# Patient Record
Sex: Female | Born: 1990 | Race: Black or African American | Hispanic: No | Marital: Married | Smoking: Former smoker
Health system: Southern US, Community
[De-identification: ages and names within clinical notes are randomized; demographics above are authoritative.]

## PROBLEM LIST (undated history)

## (undated) DIAGNOSIS — N390 Urinary tract infection, site not specified: Secondary | ICD-10-CM

## (undated) DIAGNOSIS — A599 Trichomoniasis, unspecified: Secondary | ICD-10-CM

## (undated) DIAGNOSIS — R011 Cardiac murmur, unspecified: Secondary | ICD-10-CM

## (undated) DIAGNOSIS — I10 Essential (primary) hypertension: Secondary | ICD-10-CM

## (undated) DIAGNOSIS — D649 Anemia, unspecified: Secondary | ICD-10-CM

## (undated) DIAGNOSIS — F1910 Other psychoactive substance abuse, uncomplicated: Secondary | ICD-10-CM

## (undated) HISTORY — DX: Cardiac murmur, unspecified: R01.1

## (undated) HISTORY — DX: Trichomoniasis, unspecified: A59.9

## (undated) HISTORY — DX: Other psychoactive substance abuse, uncomplicated: F19.10

## (undated) HISTORY — DX: Essential (primary) hypertension: I10

## (undated) HISTORY — PX: CYST EXCISION: SHX5701

## (undated) HISTORY — DX: Anemia, unspecified: D64.9

## (undated) HISTORY — DX: Urinary tract infection, site not specified: N39.0

---
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
## 2009-04-14 ENCOUNTER — Emergency Department (HOSPITAL_COMMUNITY): Admission: EM | Admit: 2009-04-14 | Discharge: 2009-04-14 | Payer: Self-pay | Admitting: Emergency Medicine

## 2009-04-19 ENCOUNTER — Inpatient Hospital Stay (HOSPITAL_COMMUNITY): Admission: AD | Admit: 2009-04-19 | Discharge: 2009-04-20 | Payer: Self-pay | Admitting: Obstetrics & Gynecology

## 2009-04-19 ENCOUNTER — Encounter: Payer: Self-pay | Admitting: Emergency Medicine

## 2009-04-20 ENCOUNTER — Encounter: Payer: Self-pay | Admitting: Obstetrics & Gynecology

## 2009-07-10 IMAGING — US US OB LIMITED
1 series · 14 of 28 positions shown · non-contrast
Comparison: none

OBSTETRICAL ULTRASOUND:

 This ultrasound exam was performed in the [HOSPITAL] Ultrasound Department.  The OB US report was generated in the AS system, and faxed to the ordering physician.  This report is also available in [REDACTED] PACS.

[Series 1: us ob limited · 14 of 43 slices shown]
[im 2/43]
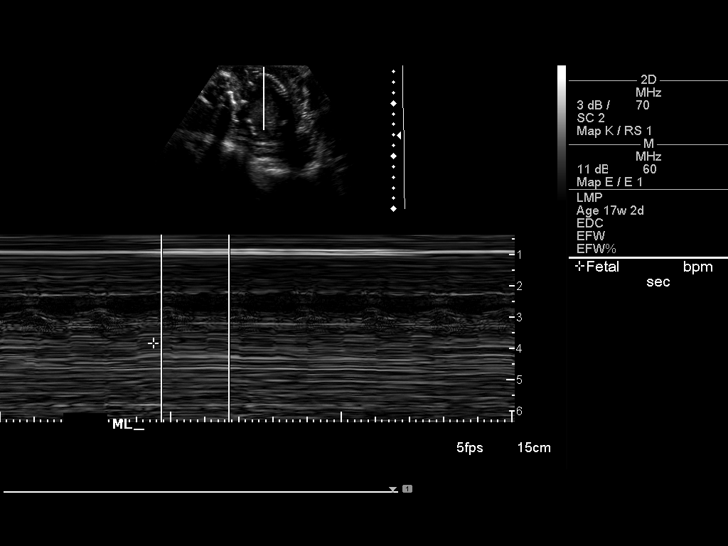
[im 5/43]
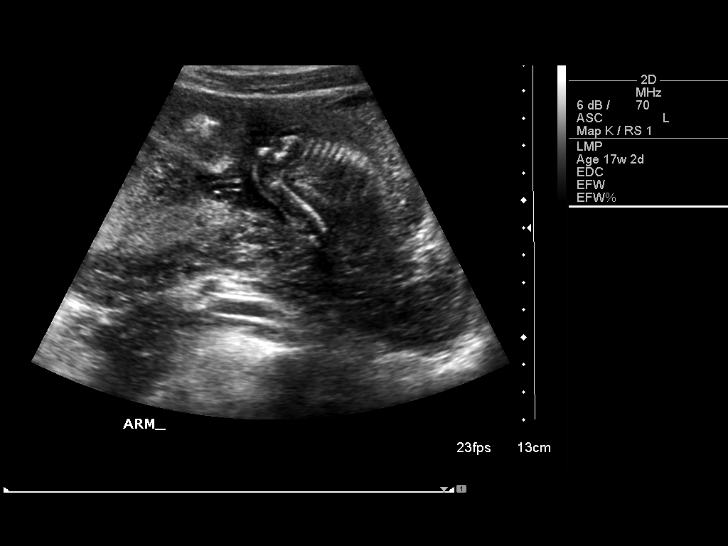
[im 8/43]
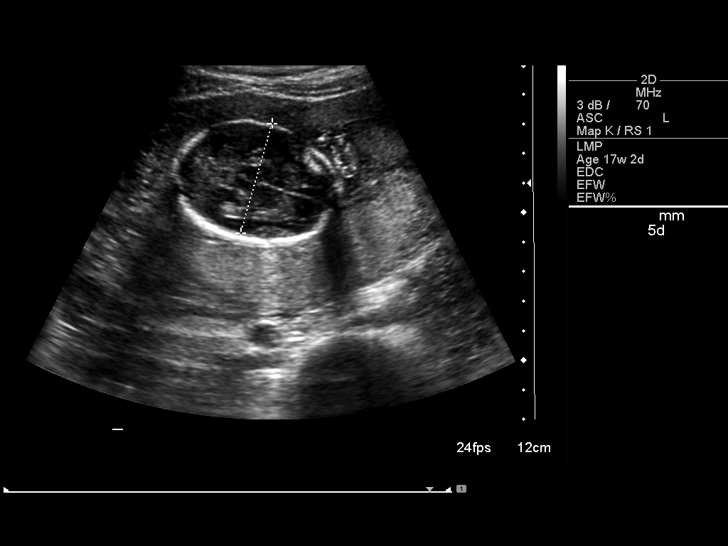
[im 11/43]
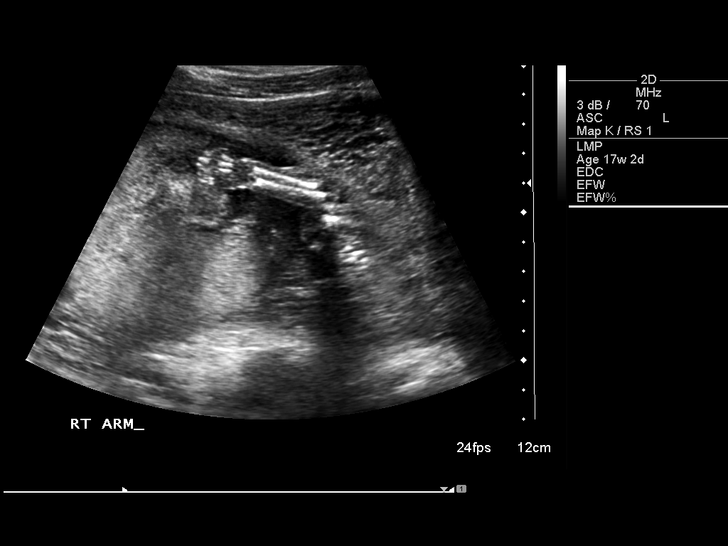
[im 15/43]
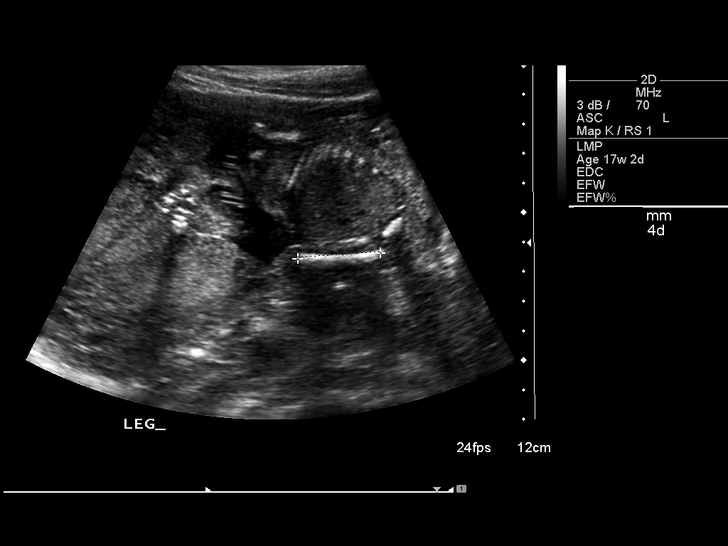
[im 18/43]
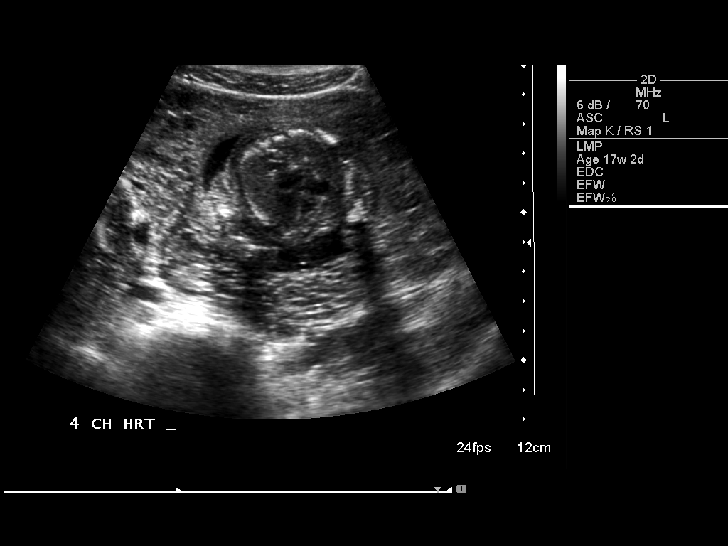
[im 21/43]
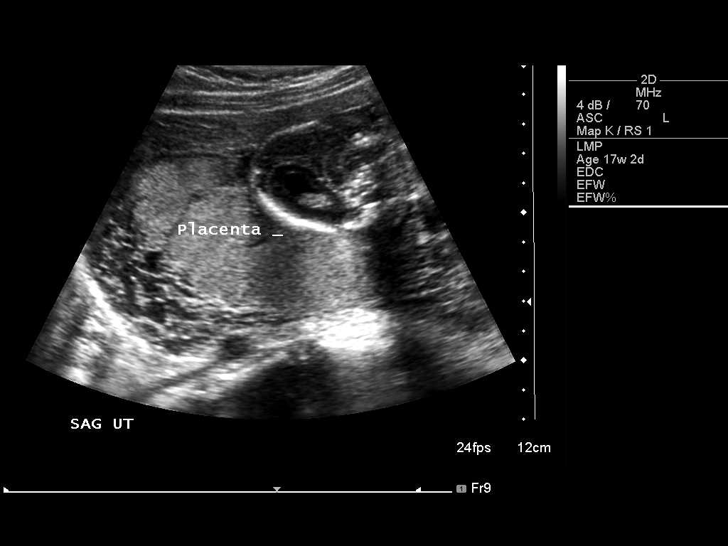
[im 24/43]
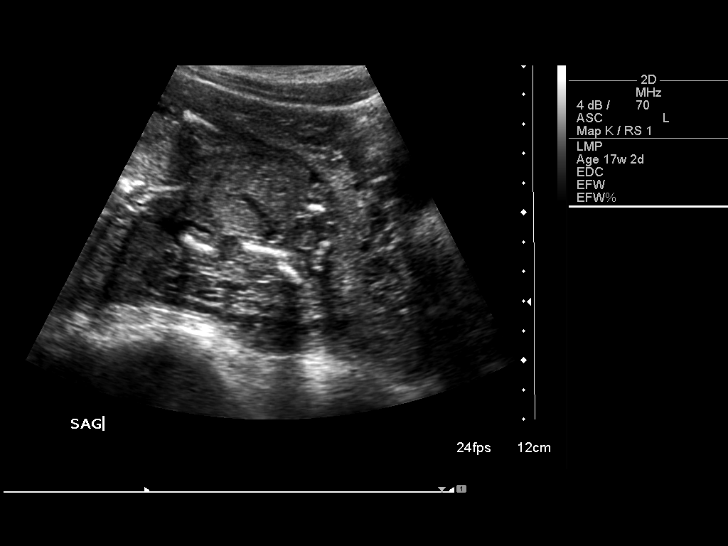
[im 27/43]
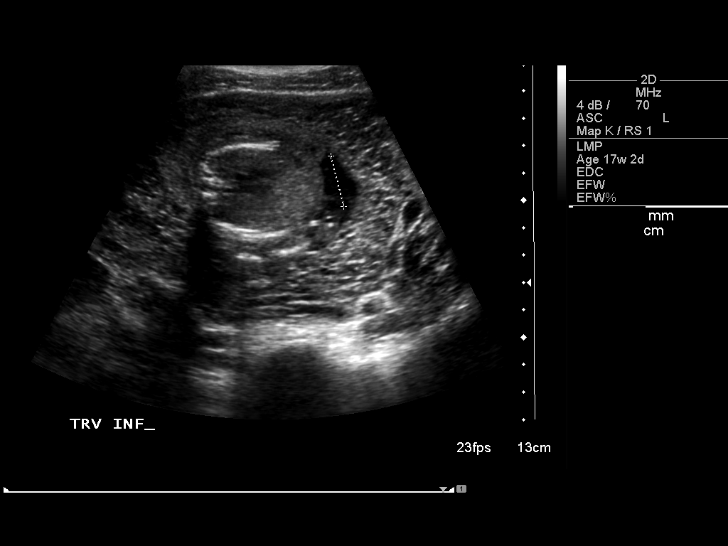
[im 30/43]
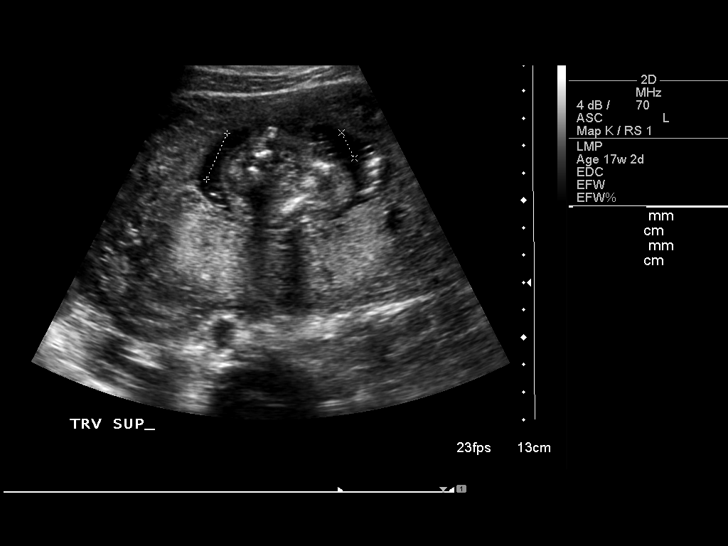
[im 33/43]
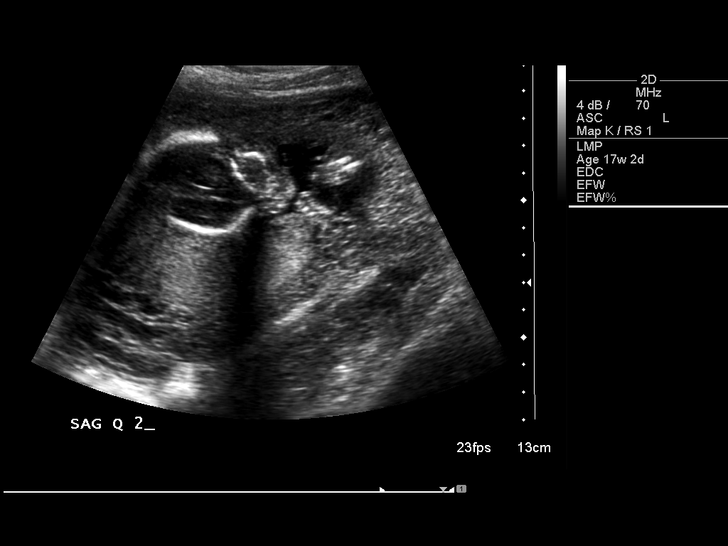
[im 36/43]
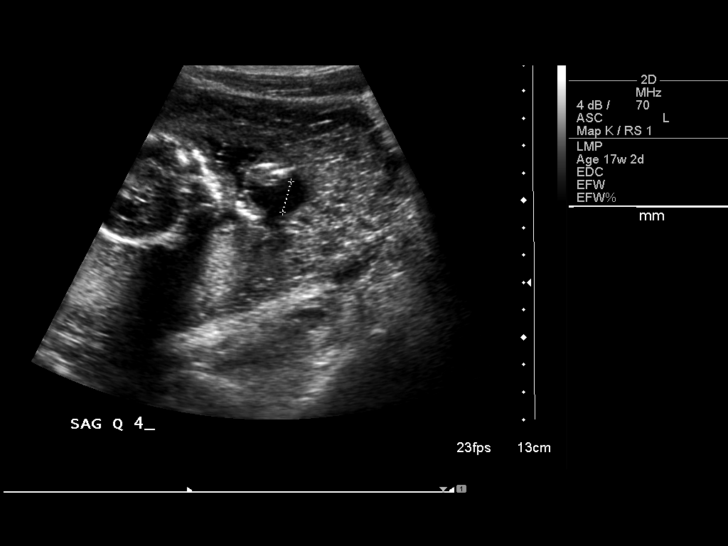
[im 39/43]
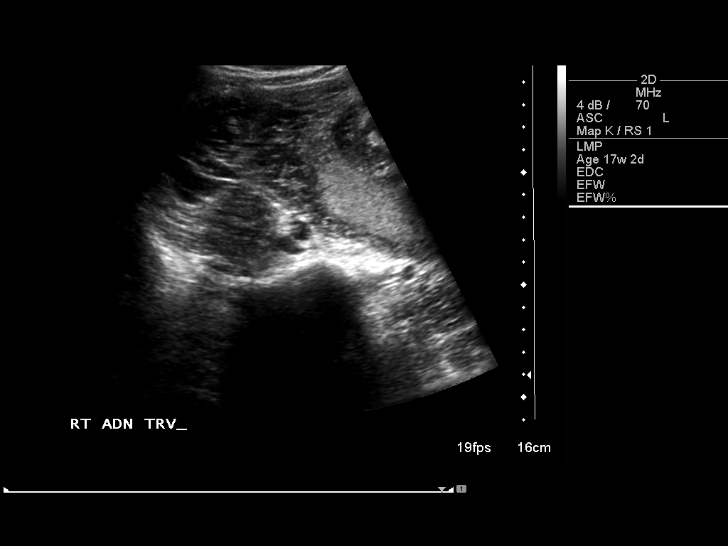
[im 43/43]
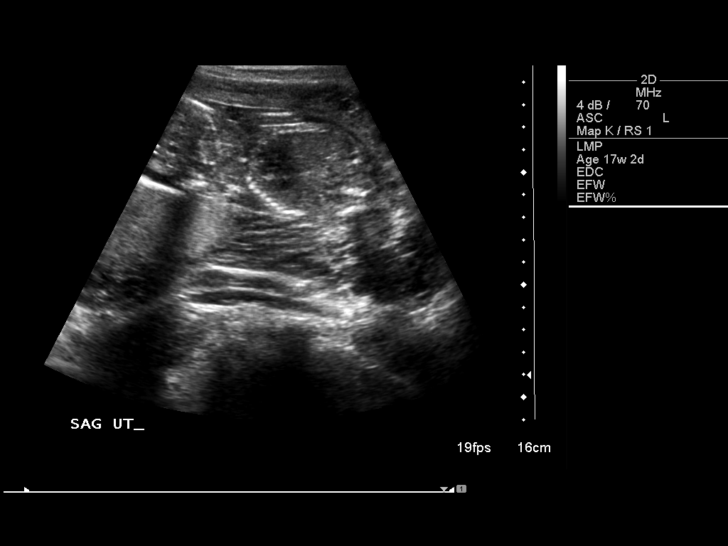

[14 of 28 positions shown; findings below may reference images not displayed]

IMPRESSION: See AS Obstetric US report.

## 2009-07-15 IMAGING — US US OB LIMITED
1 series · 14 of 19 positions shown · non-contrast
Comparison: none

CLINICAL DATA: Vaginal bleeding.  By LMP the patient is 18 weeks 0
days.

[Series 1: us ob limited · 19 acquisitions, 14 frames shown]
[im 1/19]
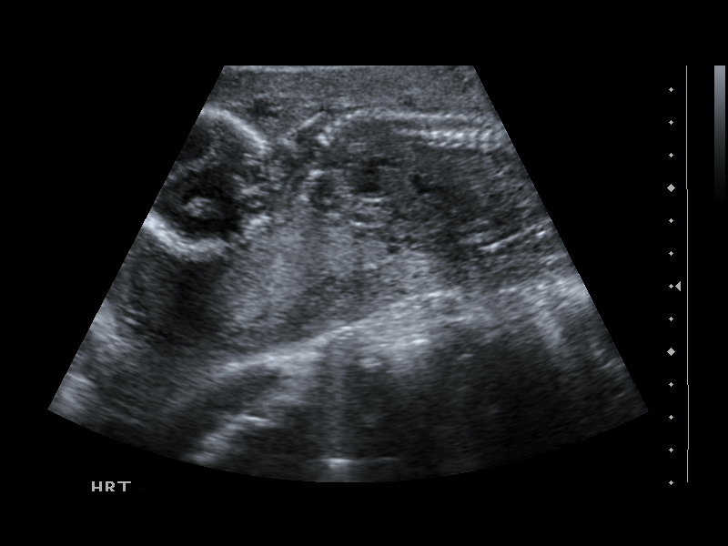
[im 3/19]
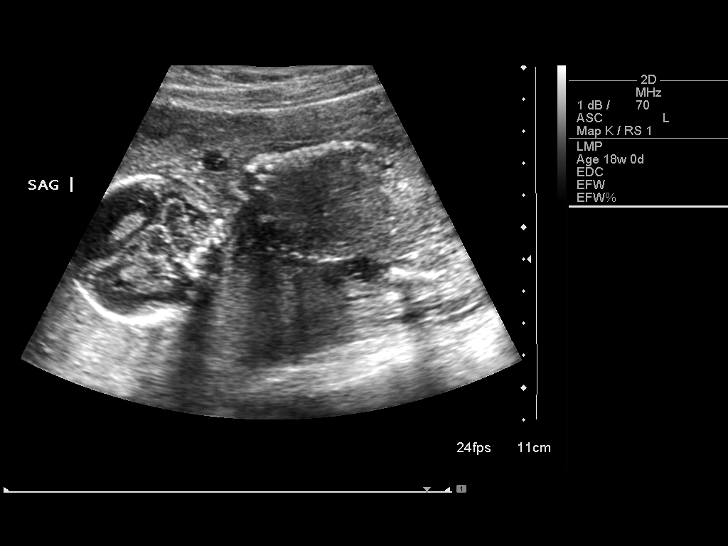
[im 4/19]
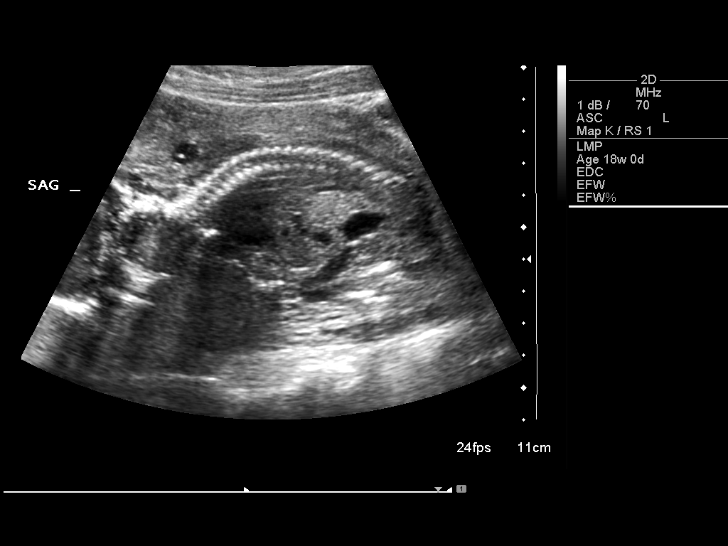
[im 5/19]
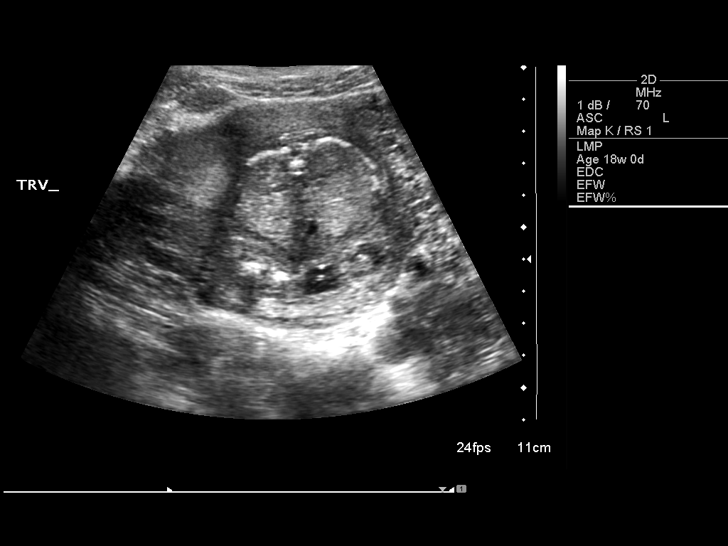
[im 7/19]
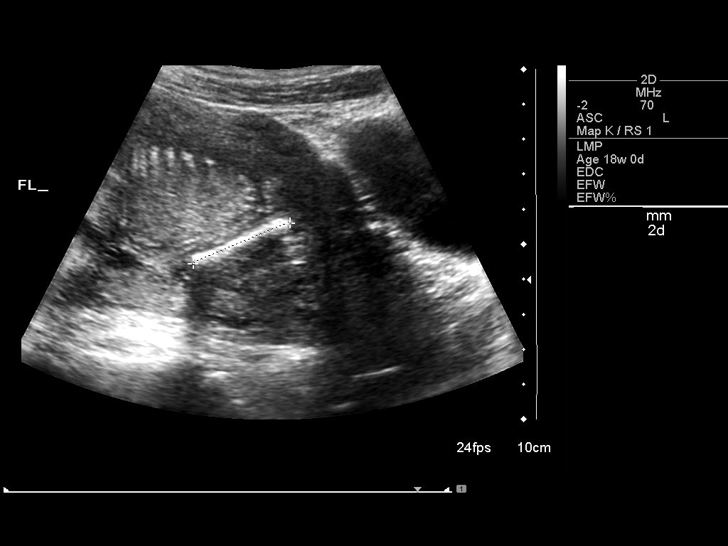
[im 8/19]
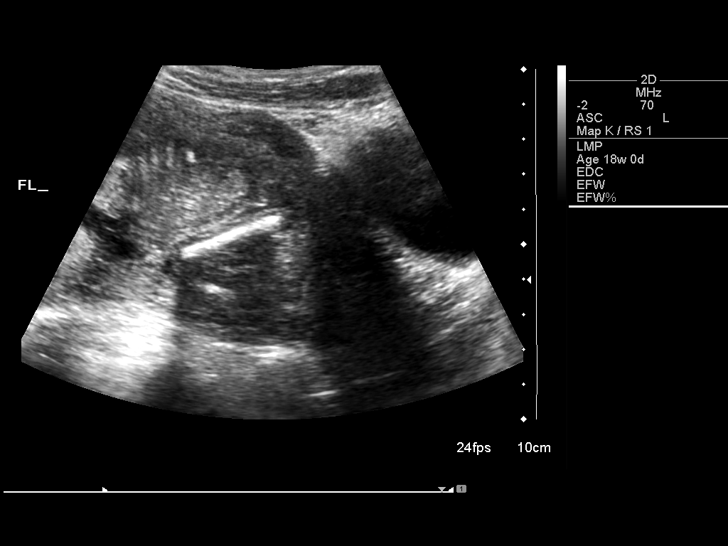
[im 9/19]
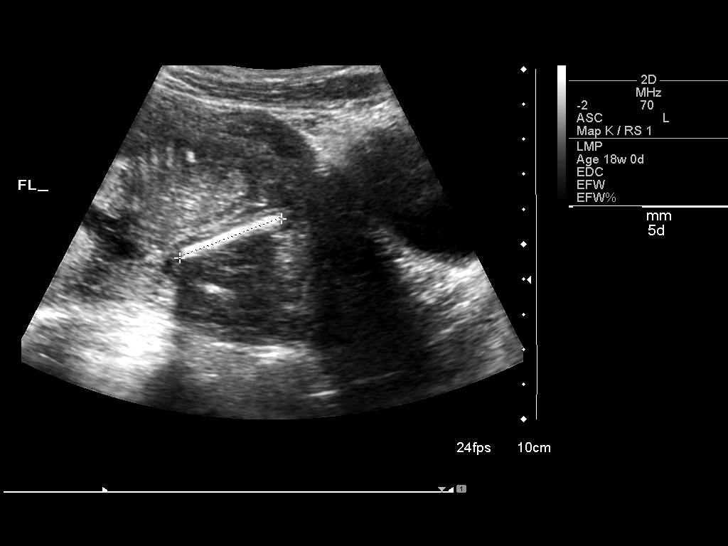
[im 11/19]
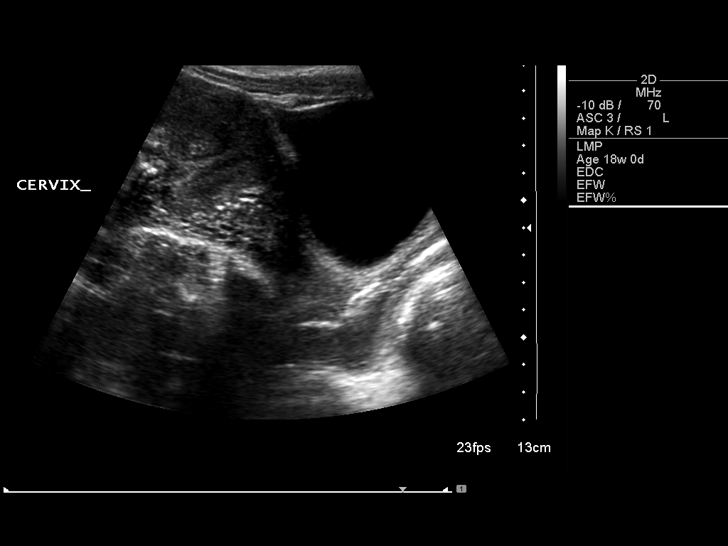
[im 12/19]
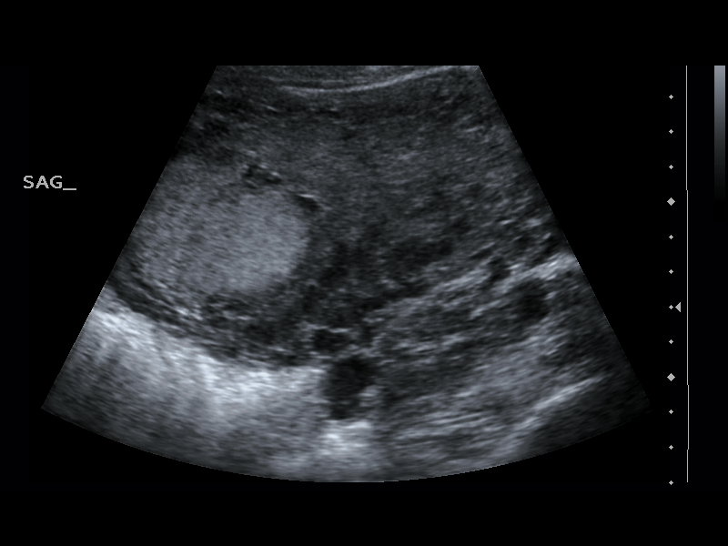
[im 13/19]
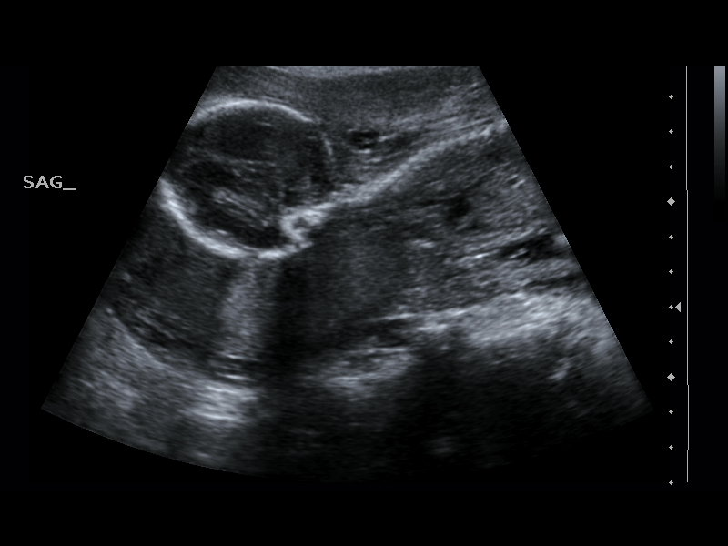
[im 15/19]
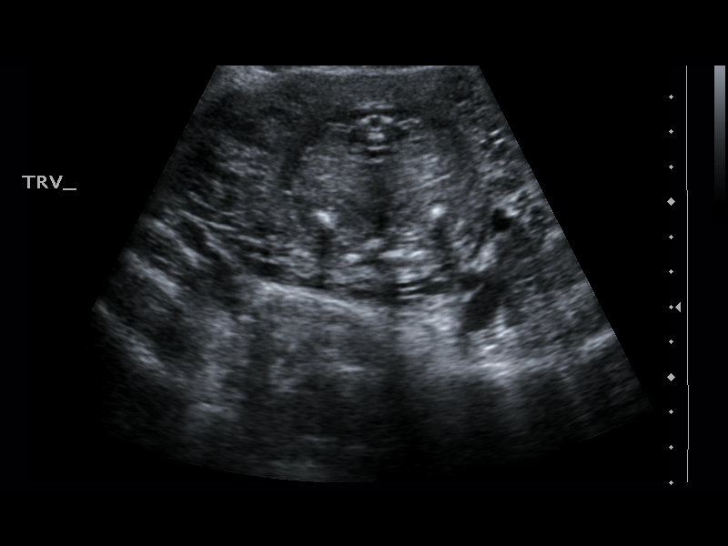
[im 16/19]
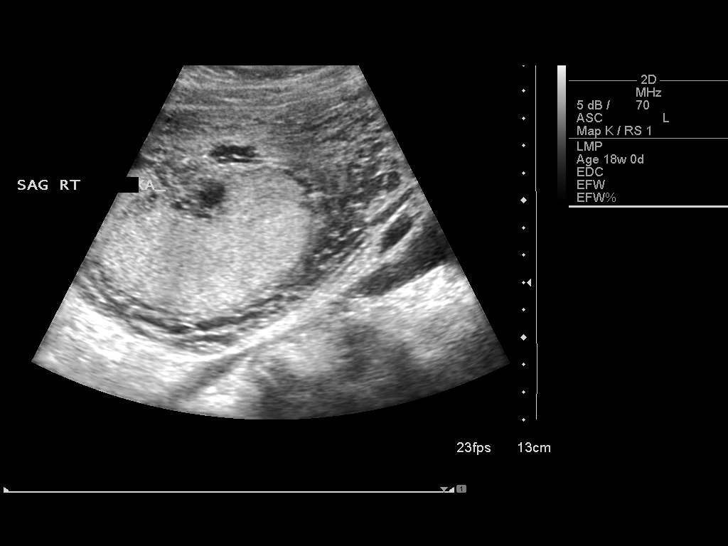
[im 17/19]
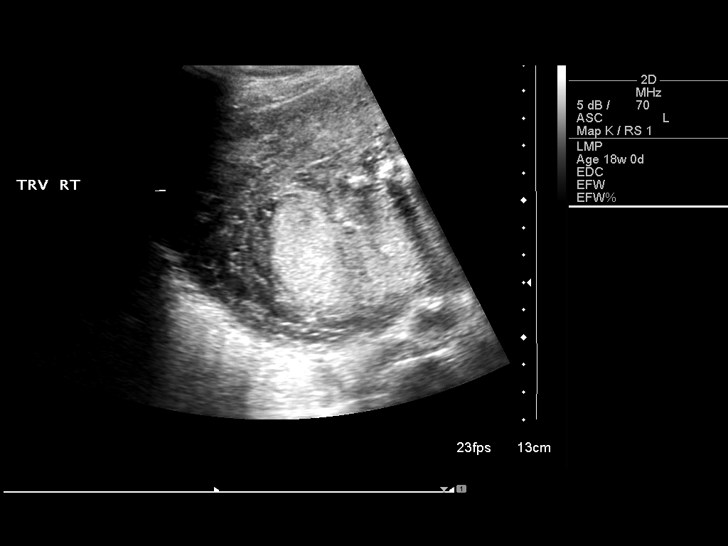
[im 19/19]
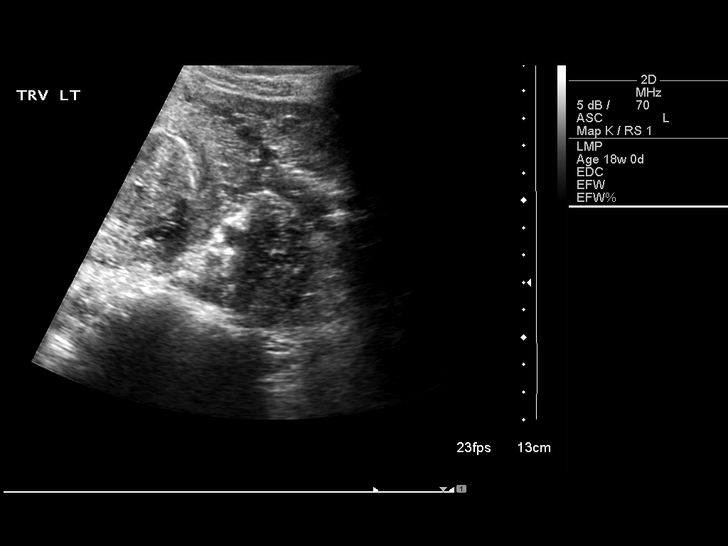

[14 of 19 positions shown; findings below may reference images not displayed]

LIMITED OBSTETRIC 14+ WK ULTRASOUND

Number of Fetuses: 1
Heart Rate: 149 bpm
Movement: Present
Presentation: Breech
Placental Location: Posterior
Previa: unknown
Amniotic Fluid (Subjective): Oligohydramnios

FETAL BIOMETRY
BPD: 4.1 cm  18w  2d
FL:    3.1 cm  19w  3d

Current Mean GA: 18w  6d      US EDC: 09/14/2009
Assigned GA: 18w 0d           Assigned EDC: 09/20/2009

MATERNAL FINDINGS:
Cervix: Closed
IMPRESSION: Single living intrauterine fetus in breech presentation.  There is
oligohydramnios.

Critical test results telephoned to Dr. Fricke at the time of

## 2011-02-20 LAB — DIFFERENTIAL
Basophils Relative: 0 % (ref 0–1)
Eosinophils Absolute: 0.1 10*3/uL (ref 0.0–0.7)
Eosinophils Relative: 1 % (ref 0–5)
Lymphocytes Relative: 13 % (ref 12–46)
Lymphs Abs: 1.2 10*3/uL (ref 0.7–4.0)
Monocytes Absolute: 0.6 10*3/uL (ref 0.1–1.0)
Monocytes Relative: 6 % (ref 3–12)
Neutro Abs: 7.9 10*3/uL — ABNORMAL HIGH (ref 1.7–7.7)
Neutrophils Relative %: 78 % — ABNORMAL HIGH (ref 43–77)

## 2011-02-20 LAB — RH IMMUNE GLOBULIN WORKUP (NOT WOMEN'S HOSP): ABO/RH(D): A NEG

## 2011-02-20 LAB — CBC
HCT: 30.5 % — ABNORMAL LOW (ref 36.0–46.0)
HCT: 30.7 % — ABNORMAL LOW (ref 36.0–46.0)
Hemoglobin: 10.1 g/dL — ABNORMAL LOW (ref 12.0–15.0)
MCHC: 33.2 g/dL (ref 30.0–36.0)
MCV: 75.5 fL — ABNORMAL LOW (ref 78.0–100.0)
Platelets: 228 10*3/uL (ref 150–400)
RBC: 4.04 MIL/uL (ref 3.87–5.11)
RBC: 4.07 MIL/uL (ref 3.87–5.11)
WBC: 9.3 10*3/uL (ref 4.0–10.5)

## 2011-02-20 LAB — BASIC METABOLIC PANEL
BUN: 4 mg/dL — ABNORMAL LOW (ref 6–23)
CO2: 24 mEq/L (ref 19–32)
Chloride: 103 mEq/L (ref 96–112)
Creatinine, Ser: 0.54 mg/dL (ref 0.4–1.2)
GFR calc Af Amer: >60 mL/min (ref >60)
GFR calc Af Amer: >60 mL/min (ref >60)
GFR calc non Af Amer: >60 mL/min (ref >60)
Potassium: 3.8 mEq/L (ref 3.5–5.1)
Potassium: 3.8 mEq/L (ref 3.5–5.1)
Sodium: 132 mEq/L — ABNORMAL LOW (ref 135–145)

## 2011-02-20 LAB — URINALYSIS, ROUTINE W REFLEX MICROSCOPIC
Glucose, UA: NEGATIVE mg/dL
Ketones, ur: NEGATIVE mg/dL
Leukocytes, UA: NEGATIVE
pH: 6 (ref 5.0–8.0)

## 2011-02-20 LAB — URINE MICROSCOPIC-ADD ON

## 2011-02-20 LAB — GC/CHLAMYDIA PROBE AMP, GENITAL: Chlamydia, DNA Probe: NEGATIVE

## 2011-02-20 LAB — STREP B DNA PROBE: Strep Group B Ag: NEGATIVE

## 2011-11-28 ENCOUNTER — Other Ambulatory Visit: Payer: Self-pay | Admitting: Obstetrics & Gynecology

## 2011-11-28 ENCOUNTER — Other Ambulatory Visit (HOSPITAL_COMMUNITY)
Admission: RE | Admit: 2011-11-28 | Discharge: 2011-11-28 | Disposition: A | Discharge status: Home / Self Care | Payer: Self-pay | Source: Ambulatory Visit | Attending: Obstetrics & Gynecology | Admitting: Obstetrics & Gynecology

## 2011-11-28 DIAGNOSIS — Z01419 Encounter for gynecological examination (general) (routine) without abnormal findings: Secondary | ICD-10-CM | POA: Insufficient documentation

## 2013-02-12 ENCOUNTER — Other Ambulatory Visit: Payer: Self-pay | Admitting: Obstetrics & Gynecology

## 2013-03-15 ENCOUNTER — Other Ambulatory Visit: Payer: Self-pay | Admitting: Obstetrics & Gynecology

## 2013-03-19 ENCOUNTER — Encounter: Payer: Self-pay | Admitting: Adult Health

## 2013-03-19 ENCOUNTER — Ambulatory Visit (INDEPENDENT_AMBULATORY_CARE_PROVIDER_SITE_OTHER): Payer: BC Managed Care – PPO | Admitting: Adult Health

## 2013-03-19 ENCOUNTER — Other Ambulatory Visit (HOSPITAL_COMMUNITY)
Admission: RE | Admit: 2013-03-19 | Discharge: 2013-03-19 | Disposition: A | Discharge status: Home / Self Care | Payer: Self-pay | Source: Ambulatory Visit | Attending: Obstetrics and Gynecology | Admitting: Obstetrics and Gynecology

## 2013-03-19 VITALS — BP 130/90 | HR 80 | Ht 63.5 in | Wt 154.0 lb

## 2013-03-19 DIAGNOSIS — I10 Essential (primary) hypertension: Secondary | ICD-10-CM

## 2013-03-19 DIAGNOSIS — Z01419 Encounter for gynecological examination (general) (routine) without abnormal findings: Secondary | ICD-10-CM | POA: Insufficient documentation

## 2013-03-19 DIAGNOSIS — Z8679 Personal history of other diseases of the circulatory system: Secondary | ICD-10-CM | POA: Insufficient documentation

## 2013-03-19 DIAGNOSIS — Z309 Encounter for contraceptive management, unspecified: Secondary | ICD-10-CM

## 2013-03-19 HISTORY — DX: Essential (primary) hypertension: I10

## 2013-03-19 NOTE — Patient Instructions (Addendum)
Decrease salt  Try to relax Follow up in 2 weeks to check BP Sign up for my chart

## 2013-03-19 NOTE — Progress Notes (Signed)
Patient ID: Kayla Franklin, female   DOB: 1991/07/17, 22 y.o.   MRN: [id number] History of Present Illness: Kayla Franklin is a 22 year old black female in for Pap and physical.  Current Medications, Allergies, Past Medical History, Past Surgical History, Family History and Social History were reviewed in [employer] electronic medical record.    Review of Systems: Patient denies any headaches, blurred vision, shortness of breath, chest pain, abdominal pain, problems with bowel movements, urination, or intercourse. No joint pain or mood changes She says she was irritated with people this am and her car had a light come on and she was stressed.   Physical Exam:Blood pressure 130/90, pulse 80, height 5' 3.5" (1.613 m), weight 154 lb (69.854 kg), last menstrual period 03/12/2013.Her BP on arrival was 156/106 General:  Well developed, well nourished, no acute distress Skin:  Warm and dry Neck:  Midline trachea, normal thyroid Lungs; Clear to auscultation bilaterally Breast:  No dominant palpable mass, retraction, or nipple discharge Cardiovascular: Regular rate and rhythm Abdomen:  Soft, non tender, no hepatosplenomegaly Pelvic:  External genitalia is normal in appearance.  The vagina is normal in appearance. The cervix is bulbous. Pap performed. Uterus is felt to be normal size, shape, and contour.  No adnexal masses or tenderness noted. Extremities:  No swelling or varicosities noted Psych:  No mood changes  Impression: Yearly exam Hypertension Contraceptive management  Plan: Follow up BP check in 2 weeks  Decrease salt Try to relax Physical in 1 year

## 2013-04-01 ENCOUNTER — Encounter: Payer: Self-pay | Admitting: *Deleted

## 2013-04-02 ENCOUNTER — Ambulatory Visit (INDEPENDENT_AMBULATORY_CARE_PROVIDER_SITE_OTHER): Payer: BC Managed Care – PPO | Admitting: Adult Health

## 2013-04-02 ENCOUNTER — Telehealth: Payer: Self-pay | Admitting: Adult Health

## 2013-04-02 ENCOUNTER — Encounter: Payer: Self-pay | Admitting: Adult Health

## 2013-04-02 VITALS — BP 140/94 | Ht 63.0 in | Wt 154.0 lb

## 2013-04-02 DIAGNOSIS — Z309 Encounter for contraceptive management, unspecified: Secondary | ICD-10-CM

## 2013-04-02 DIAGNOSIS — I10 Essential (primary) hypertension: Secondary | ICD-10-CM

## 2013-04-02 DIAGNOSIS — Z3049 Encounter for surveillance of other contraceptives: Secondary | ICD-10-CM

## 2013-04-02 MED ORDER — NORETHINDRONE 0.35 MG PO TABS: 1.0000 | ORAL_TABLET | Freq: Every day | ORAL | Status: DC

## 2013-04-02 NOTE — Telephone Encounter (Signed)
Left message that she needs POP and that is about it, call me back

## 2013-04-02 NOTE — Patient Instructions (Addendum)

## 2013-04-02 NOTE — Telephone Encounter (Signed)
Pt states Micronor to expensive, can you prescribe cheaper BCP- insurance is [employer]

## 2013-04-02 NOTE — Progress Notes (Signed)
Subjective:     Patient ID: Kayla Franklin, female   DOB: 1991-04-21, 22 y.o.   MRN: [id number]  HPI Tandra is back for a BP check and her BP is still elevated at 140/94, she has no complaints.  Review of Systems No complaints.  Reviewed past medical,surgical, social and family history. Reviewed medications and allergies.     Objective:   Physical Exam Blood pressure 140/94, height 5\' 3"  (1.6 m), weight 154 lb (69.854 kg), last menstrual period 03/12/2013. Skin warm and dry. Lungs: clear to ausculation bilaterally. Cardiovascular: regular rate and rhythm.     Will change OCs first, and try lifestyle modification, if no help will add BP meds. Assessment:      Hypertension   Contraceptive management Plan:      Stop sprintec after this pack and start micronor, refilled micronor x 11 Use condoms Watch salt intake and increase activity Review handout on hypertension   Return in 3 weeks to check BP

## 2013-04-03 ENCOUNTER — Telehealth: Payer: Self-pay | Admitting: Adult Health

## 2013-04-03 NOTE — Telephone Encounter (Signed)
Left message to check with drug store 

## 2013-04-15 ENCOUNTER — Telehealth: Payer: Self-pay | Admitting: Adult Health

## 2013-04-15 NOTE — Telephone Encounter (Signed)
Left message that I will call micronor to [facility]

## 2013-04-23 ENCOUNTER — Ambulatory Visit: Payer: BC Managed Care – PPO | Admitting: Adult Health

## 2013-06-26 ENCOUNTER — Other Ambulatory Visit: Payer: Self-pay | Admitting: *Deleted

## 2013-06-26 MED ORDER — NORETHINDRONE 0.35 MG PO TABS: 1.0000 | ORAL_TABLET | Freq: Every day | ORAL | Status: DC

## 2014-04-13 ENCOUNTER — Other Ambulatory Visit: Payer: BC Managed Care – PPO | Admitting: Adult Health

## 2014-04-17 ENCOUNTER — Encounter: Payer: Self-pay | Admitting: Adult Health

## 2014-04-17 ENCOUNTER — Ambulatory Visit (INDEPENDENT_AMBULATORY_CARE_PROVIDER_SITE_OTHER): Payer: BC Managed Care – PPO | Admitting: Adult Health

## 2014-04-17 VITALS — BP 150/96 | HR 78 | Ht 64.0 in | Wt 160.0 lb

## 2014-04-17 DIAGNOSIS — Z3202 Encounter for pregnancy test, result negative: Secondary | ICD-10-CM

## 2014-04-17 DIAGNOSIS — Z01419 Encounter for gynecological examination (general) (routine) without abnormal findings: Secondary | ICD-10-CM

## 2014-04-17 DIAGNOSIS — I10 Essential (primary) hypertension: Secondary | ICD-10-CM

## 2014-04-17 LAB — POCT URINE PREGNANCY: PREG TEST UR: NEGATIVE

## 2014-04-17 MED ORDER — HYDROCHLOROTHIAZIDE 12.5 MG PO CAPS: 12.5000 mg | ORAL_CAPSULE | Freq: Every day | ORAL | Status: DC

## 2014-04-17 NOTE — Progress Notes (Signed)
Patient ID: Kayla Franklin, female   DOB: 1991/08/25, 23 y.o.   MRN: [id number] History of Present Illness: Inna is a 23 year old black female in for a physical. She had a normal pap 03/19/13.She stopped Micronor due to bleeding.   Current Medications, Allergies, Past Medical History, Past Surgical History, Family History and Social History were reviewed in [employer] record.     Review of Systems: Patient denies any headaches, blurred vision, shortness of breath, chest pain, abdominal pain, problems with bowel movements, urination, or intercourse. No joint pain or mood swings, has odor sometimes.Wants to talk birth control.    Physical Exam:BP 150/96  Pulse 78  Ht 5\' 4"  (1.626 m)  Wt 160 lb (72.576 kg)  BMI 27.45 kg/m2  LMP 05/19/2015UPT negative General:  Well developed, well nourished, no acute distress Skin:  Warm and dry Neck:  Midline trachea, normal thyroid Lungs; Clear to auscultation bilaterally Breast:  No dominant palpable mass, retraction, or nipple discharge Cardiovascular: Regular rate and rhythm Abdomen:  Soft, non tender, no hepatosplenomegaly Pelvic:  External genitalia is normal in appearance.  The vagina has good, moisture, and rugae. The cervix is bulbous.  Uterus is felt to be normal size, shape, and contour.  No    adnexal masses or tenderness noted. Extremities:  No swelling or varicosities noted Psych:  No mood changes, alert and cooperative,seems happy Discussed if wants OCs will give POP, til BP controlled, she said she would just use condoms til then that she is not having sx often.  Impression: Routine gyn exam no pap Hypertension     Plan: Return in 4 weeks for BP check and talk Rx HCTZ 12.5 mg #30 1 daily with 11 refills Pap and physical in 1 year Review handout on hypertension Increase water, decrease salt and use condoms

## 2014-04-17 NOTE — Patient Instructions (Signed)

## 2014-05-22 ENCOUNTER — Ambulatory Visit: Payer: BC Managed Care – PPO | Admitting: Adult Health

## 2014-09-14 ENCOUNTER — Encounter: Payer: Self-pay | Admitting: Adult Health

## 2015-08-06 ENCOUNTER — Ambulatory Visit (INDEPENDENT_AMBULATORY_CARE_PROVIDER_SITE_OTHER): Payer: BLUE CROSS/BLUE SHIELD | Admitting: Adult Health

## 2015-08-06 ENCOUNTER — Other Ambulatory Visit (HOSPITAL_COMMUNITY)
Admission: RE | Admit: 2015-08-06 | Discharge: 2015-08-06 | Disposition: A | Discharge status: Home / Self Care | Payer: Self-pay | Source: Ambulatory Visit | Attending: Adult Health | Admitting: Adult Health

## 2015-08-06 ENCOUNTER — Encounter: Payer: Self-pay | Admitting: Adult Health

## 2015-08-06 VITALS — BP 132/90 | HR 84 | Ht 63.0 in | Wt 155.0 lb

## 2015-08-06 DIAGNOSIS — Z113 Encounter for screening for infections with a predominantly sexual mode of transmission: Secondary | ICD-10-CM | POA: Insufficient documentation

## 2015-08-06 DIAGNOSIS — Z01419 Encounter for gynecological examination (general) (routine) without abnormal findings: Secondary | ICD-10-CM

## 2015-08-06 DIAGNOSIS — I10 Essential (primary) hypertension: Secondary | ICD-10-CM

## 2015-08-06 MED ORDER — HYDROCHLOROTHIAZIDE 12.5 MG PO CAPS: 12.5000 mg | ORAL_CAPSULE | Freq: Every day | ORAL | Status: DC

## 2015-08-06 NOTE — Patient Instructions (Signed)
Physical in 1 year Take microzide for BP Hypertension Hypertension, commonly called high blood pressure, is when the force of blood pumping through your arteries is too strong. Your arteries are the blood vessels that carry blood from your heart throughout your body. A blood pressure reading consists of a higher number over a lower number, such as 110/72. The higher number (systolic) is the pressure inside your arteries when your heart pumps. The lower number (diastolic) is the pressure inside your arteries when your heart relaxes. Ideally you want your blood pressure below 120/80. Hypertension forces your heart to work harder to pump blood. Your arteries may become narrow or stiff. Having hypertension puts you at risk for heart disease, stroke, and other problems.  RISK FACTORS Some risk factors for high blood pressure are controllable. Others are not.  Risk factors you cannot control include:   Race. You may be at higher risk if you are African American.  Age. Risk increases with age.  Gender. Men are at higher risk than women before age 26 years. After age 22, women are at higher risk than men. Risk factors you can control include:  Not getting enough exercise or physical activity.  Being overweight.  Getting too much fat, sugar, calories, or salt in your diet.  Drinking too much alcohol. SIGNS AND SYMPTOMS Hypertension does not usually cause signs or symptoms. Extremely high blood pressure (hypertensive crisis) may cause headache, anxiety, shortness of breath, and nosebleed. DIAGNOSIS  To check if you have hypertension, your health care provider will measure your blood pressure while you are seated, with your arm held at the level of your heart. It should be measured at least twice using the same arm. Certain conditions can cause a difference in blood pressure between your right and left arms. A blood pressure reading that is higher than normal on one occasion does not mean that you  need treatment. If one blood pressure reading is high, ask your health care provider about having it checked again. TREATMENT  Treating high blood pressure includes making lifestyle changes and possibly taking medicine. Living a healthy lifestyle can help lower high blood pressure. You may need to change some of your habits. Lifestyle changes may include:  Following the DASH diet. This diet is high in fruits, vegetables, and whole grains. It is low in salt, red meat, and added sugars.  Getting at least 2 hours of brisk physical activity every week.  Losing weight if necessary.  Not smoking.  Limiting alcoholic beverages.  Learning ways to reduce stress. If lifestyle changes are not enough to get your blood pressure under control, your health care provider may prescribe medicine. You may need to take more than one. Work closely with your health care provider to understand the risks and benefits. HOME CARE INSTRUCTIONS  Have your blood pressure rechecked as directed by your health care provider.   Take medicines only as directed by your health care provider. Follow the directions carefully. Blood pressure medicines must be taken as prescribed. The medicine does not work as well when you skip doses. Skipping doses also puts you at risk for problems.   Do not smoke.   Monitor your blood pressure at home as directed by your health care provider. SEEK MEDICAL CARE IF:   You think you are having a reaction to medicines taken.  You have recurrent headaches or feel dizzy.  You have swelling in your ankles.  You have trouble with your vision. SEEK IMMEDIATE MEDICAL CARE IF:  You develop a severe headache or confusion.  You have unusual weakness, numbness, or feel faint.  You have severe chest or abdominal pain.  You vomit repeatedly.  You have trouble breathing. MAKE SURE YOU:   Understand these instructions.  Will watch your condition.  Will get help right away if you  are not doing well or get worse. Document Released: 10/30/2005 Document Revised: 03/16/2014 Document Reviewed: 08/22/2013 [facility] Patient Information 2015 [place], [place]. This information is not intended to replace advice given to you by your health care provider. Make sure you discuss any questions you have with your health care provider.

## 2015-08-06 NOTE — Progress Notes (Signed)
Patient ID: Kayla Franklin, female   DOB: 1991-09-04, 24 y.o.   MRN: [id number] History of Present Illness: Kayla Franklin is a 24 year old black female in for well woman gyn exam and pap.Has not been taking microzide.   Current Medications, Allergies, Past Medical History, Past Surgical History, Family History and Social History were reviewed in [employer] record.     Review of Systems: Patient denies any headaches, hearing loss, fatigue, blurred vision, shortness of breath, chest pain, abdominal pain, problems with bowel movements, urination, or intercourse(not having often). No joint pain or mood swings. 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   Physical Exam:BP 132/90 mmHg  Pulse 84  Ht 5\' 3"  (1.6 m)  Wt 155 lb (70.308 kg)  BMI 27.46 kg/m2  LMP 08/01/2015 General:  Well developed, well nourished, no acute distress Skin:  Warm and dry Neck:  Midline trachea, normal thyroid, good ROM, no lymphadenopathy Lungs; Clear to auscultation bilaterally Breast:  No dominant palpable mass, retraction, or nipple discharge Cardiovascular: Regular rate and rhythm Abdomen:  Soft, non tender, no hepatosplenomegaly Pelvic:  External genitalia is normal in appearance, no lesions.  The vagina is normal in appearance. Urethra has no lesions or masses. The cervix is tiny, pap with GC/CHL performed.  Uterus is felt to be normal size, shape, and contour.  No adnexal masses or tenderness noted.Bladder is non tender, no masses felt. Extremities/musculoskeletal:  No swelling or varicosities noted, no clubbing or cyanosis Psych:  No mood changes, alert and cooperative,seems happy   Impression: Well woman gyn exam with pap Hypertension     Plan: Rx microzide 12.5 mg take 1 daily #30 with 11 refills Physical in 1 year Use condoms Check CBC,CMP,TSH and lipids

## 2015-08-07 LAB — COMPREHENSIVE METABOLIC PANEL
A/G RATIO: 1.8 (ref 1.1–2.5)
ALBUMIN: 4.4 g/dL (ref 3.5–5.5)
ALT: 15 IU/L (ref 0–32)
AST: 19 IU/L (ref 0–40)
Alkaline Phosphatase: 59 IU/L (ref 39–117)
BILIRUBIN TOTAL: 0.3 mg/dL (ref 0.0–1.2)
BUN / CREAT RATIO: 13 (ref 8–20)
BUN: 10 mg/dL (ref 6–20)
CALCIUM: 9.3 mg/dL (ref 8.7–10.2)
CHLORIDE: 104 mmol/L (ref 97–108)
CO2: 24 mmol/L (ref 18–29)
Creatinine, Ser: 0.78 mg/dL (ref 0.57–1.00)
GFR, EST AFRICAN AMERICAN: 122 mL/min/{1.73_m2} (ref >59)
GFR, EST NON AFRICAN AMERICAN: 106 mL/min/{1.73_m2} (ref >59)
GLUCOSE: 74 mg/dL (ref 65–99)
Globulin, Total: 2.5 g/dL (ref 1.5–4.5)
Potassium: 4.6 mmol/L (ref 3.5–5.2)
Sodium: 143 mmol/L (ref 134–144)
TOTAL PROTEIN: 6.9 g/dL (ref 6.0–8.5)

## 2015-08-07 LAB — CBC
HEMOGLOBIN: 11.8 g/dL (ref 11.1–15.9)
Hematocrit: 38.5 % (ref 34.0–46.6)
MCH: 23.4 pg — AB (ref 26.6–33.0)
MCHC: 30.6 g/dL — ABNORMAL LOW (ref 31.5–35.7)
MCV: 76 fL — AB (ref 79–97)
PLATELETS: 291 10*3/uL (ref 150–379)
RBC: 5.04 x10E6/uL (ref 3.77–5.28)
RDW: 14.8 % (ref 12.3–15.4)
WBC: 5.8 10*3/uL (ref 3.4–10.8)

## 2015-08-07 LAB — LIPID PANEL
CHOL/HDL RATIO: 2 ratio (ref 0.0–4.4)
Cholesterol, Total: 147 mg/dL (ref 100–199)
HDL: 75 mg/dL (ref >39)
LDL CALC: 61 mg/dL (ref 0–99)
TRIGLYCERIDES: 56 mg/dL (ref 0–149)
VLDL Cholesterol Cal: 11 mg/dL (ref 5–40)

## 2015-08-07 LAB — TSH: TSH: 1.56 u[IU]/mL (ref 0.450–4.500)

## 2015-08-10 ENCOUNTER — Telehealth: Payer: Self-pay | Admitting: Adult Health

## 2015-08-10 LAB — CYTOLOGY - PAP

## 2015-08-10 MED ORDER — METRONIDAZOLE 500 MG PO TABS: 500.0000 mg | ORAL_TABLET | Freq: Two times a day (BID) | ORAL | Status: DC

## 2015-08-10 NOTE — Telephone Encounter (Signed)
Left message that pap normal with negative GC/CHL but +BV, will rx flagyl

## 2015-08-10 NOTE — Telephone Encounter (Signed)
Left message about labs, take MV with fe

## 2017-05-28 ENCOUNTER — Other Ambulatory Visit: Payer: Self-pay | Admitting: Obstetrics and Gynecology

## 2017-05-28 DIAGNOSIS — O3680X0 Pregnancy with inconclusive fetal viability, not applicable or unspecified: Secondary | ICD-10-CM

## 2017-05-29 ENCOUNTER — Other Ambulatory Visit: Payer: BLUE CROSS/BLUE SHIELD

## 2017-06-14 ENCOUNTER — Ambulatory Visit (INDEPENDENT_AMBULATORY_CARE_PROVIDER_SITE_OTHER): Payer: Medicaid Other

## 2017-06-14 DIAGNOSIS — O3680X0 Pregnancy with inconclusive fetal viability, not applicable or unspecified: Secondary | ICD-10-CM

## 2017-06-14 NOTE — Progress Notes (Signed)
[place] 10 wks,single IUP w/ys,positive fht 157 bpm,normal ovaries bilat,crl 32.93 mm,mult fibroids (#1) fundal right pedunculated 7 x 4.2 x 5 cm,(#2) post intramural 3.5 x 3.1 x 2.6 cm, EDD  01/10/2018

## 2017-06-28 ENCOUNTER — Other Ambulatory Visit: Payer: Self-pay | Admitting: Advanced Practice Midwife

## 2017-06-28 DIAGNOSIS — Z3682 Encounter for antenatal screening for nuchal translucency: Secondary | ICD-10-CM

## 2017-07-05 ENCOUNTER — Encounter: Payer: Self-pay | Admitting: Advanced Practice Midwife

## 2017-07-05 ENCOUNTER — Ambulatory Visit (INDEPENDENT_AMBULATORY_CARE_PROVIDER_SITE_OTHER): Payer: Medicaid Other | Admitting: Advanced Practice Midwife

## 2017-07-05 ENCOUNTER — Ambulatory Visit (INDEPENDENT_AMBULATORY_CARE_PROVIDER_SITE_OTHER): Payer: Medicaid Other

## 2017-07-05 VITALS — BP 90/70 | HR 80 | Wt 170.0 lb

## 2017-07-05 DIAGNOSIS — Z3682 Encounter for antenatal screening for nuchal translucency: Secondary | ICD-10-CM

## 2017-07-05 DIAGNOSIS — O161 Unspecified maternal hypertension, first trimester: Secondary | ICD-10-CM

## 2017-07-05 DIAGNOSIS — R011 Cardiac murmur, unspecified: Secondary | ICD-10-CM

## 2017-07-05 DIAGNOSIS — O09891 Supervision of other high risk pregnancies, first trimester: Secondary | ICD-10-CM | POA: Diagnosis not present

## 2017-07-05 DIAGNOSIS — Z1379 Encounter for other screening for genetic and chromosomal anomalies: Secondary | ICD-10-CM

## 2017-07-05 DIAGNOSIS — O0991 Supervision of high risk pregnancy, unspecified, first trimester: Secondary | ICD-10-CM

## 2017-07-05 DIAGNOSIS — F1910 Other psychoactive substance abuse, uncomplicated: Secondary | ICD-10-CM

## 2017-07-05 DIAGNOSIS — Z3A13 13 weeks gestation of pregnancy: Secondary | ICD-10-CM | POA: Diagnosis not present

## 2017-07-05 DIAGNOSIS — I10 Essential (primary) hypertension: Secondary | ICD-10-CM

## 2017-07-05 DIAGNOSIS — Z1389 Encounter for screening for other disorder: Secondary | ICD-10-CM

## 2017-07-05 DIAGNOSIS — Z3402 Encounter for supervision of normal first pregnancy, second trimester: Secondary | ICD-10-CM

## 2017-07-05 DIAGNOSIS — Z331 Pregnant state, incidental: Secondary | ICD-10-CM

## 2017-07-05 DIAGNOSIS — Z3482 Encounter for supervision of other normal pregnancy, second trimester: Secondary | ICD-10-CM

## 2017-07-05 LAB — POCT URINALYSIS DIPSTICK
Glucose, UA: NEGATIVE
Ketones, UA: NEGATIVE
Leukocytes, UA: NEGATIVE
Nitrite, UA: NEGATIVE
PROTEIN UA: NEGATIVE
RBC UA: NEGATIVE

## 2017-07-05 MED ORDER — PNV PRENATAL PLUS MULTIVITAMIN 27-1 MG PO TABS: 1.0000 | ORAL_TABLET | Freq: Every day | ORAL | 11 refills | Status: DC

## 2017-07-05 MED ORDER — ASPIRIN EC 81 MG PO TBEC: 81.0000 mg | DELAYED_RELEASE_TABLET | Freq: Every day | ORAL | 6 refills | Status: DC

## 2017-07-05 NOTE — Progress Notes (Signed)
  Subjective:    Kayla Franklin is a [id number] [redacted]w[redacted]d being seen today for her first obstetrical visit.  Her obstetrical history is significant for 18 week TAB d/t "oligohydramnios".  Records not available. Pt states she did not have PPROM.  Has hx of CHTN, was on HCTZ for a little while, stopped on her own. Pregnancy history fully reviewed.  Patient reports no complaints. 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   Vitals:   07/05/17 0903  BP: 90/70  Pulse: 80  Weight: 170 lb (77.1 kg)    HISTORY: OB History  Gravida Para Term Preterm AB Living  3       2    SAB TAB Ectopic Multiple Live Births    1          # Outcome Date GA Lbr Len/2nd Weight Sex Delivery Anes PTL Lv  3 Current           2 AB 2010 [redacted]w[redacted]d   M    DEC  1 TAB 2008             Past Medical History:  Diagnosis Date  . Heart murmur   . Heart murmur   . Hypertension 03/19/2013  . Substance abuse   . Trichimoniasis   . UTI (lower urinary tract infection)    Past Surgical History:  Procedure Laterality Date  . CYST EXCISION     nasal cyst   Family History  Problem Relation Age of Onset  . Heart disease Father        CHF  . Hypertension Father   . Hypertension Sister   . Hypertension Mother   . Hypertension Paternal Grandmother   . Diabetes Other        great grandmother     Exam                                      System:     Skin: normal coloration and turgor, no rashes    Neurologic: oriented, normal, normal mood   Extremities: normal strength, tone, and muscle mass   HEENT PERRLA   Mouth/Teeth mucous membranes moist, normal dentition   Neck supple and no masses   Cardiovascular: regular rate and rhythm   Respiratory:  appears well, vitals normal, no respiratory distress, acyanotic   Abdomen: soft, non-tender;            Assessment:    Pregnancy: [id number] Patient Active Problem List   Diagnosis Date Noted  . Supervision of normal pregnancy 07/05/2017  . Heart murmur   .  Hypertension 03/19/2013     [place] 13 wks,measurements c/w dates,NT 2.1 mm,NB present,crl 68.15 mm,post pl gr 0,mult fibroids N/C ,normal ovaries bilat,fhr 152 bpm   Plan:     Initial labs drawn. Add CMP and 24 hour urine for baseline protein Continue prenatal vitamins  Start ASA 81mg  Problem list reviewed and updated  Reviewed n/v relief measures and warning s/s to report  Reviewed recommended weight gain based on pre-gravid BMI  Encouraged well-balanced diet Genetic Screening discussed Integrated Screen: discussed  Ultrasound discussed; fetal survey: requested.  Return in about 3 weeks (around 07/26/2017) for LROB.  CRESENZO-DISHMAN,Zikeria Keough 07/05/2017

## 2017-07-05 NOTE — Progress Notes (Signed)
[place] 13 wks,measurements c/w dates,NT 2.1 mm,NB present,crl 68.15 mm,post pl gr 0,mult fibroids N/C ,normal ovaries bilat,fhr 152 bpm

## 2017-07-05 NOTE — Patient Instructions (Signed)
 First Trimester of Pregnancy The first trimester of pregnancy is from week 1 until the end of week 12 (months 1 through 3). A week after a sperm fertilizes an egg, the egg will implant on the wall of the uterus. This embryo will begin to develop into a baby. Genes from you and your partner are forming the baby. The female genes determine whether the baby is a boy or a girl. At 6-8 weeks, the eyes and face are formed, and the heartbeat can be seen on ultrasound. At the end of 12 weeks, all the baby's organs are formed.  Now that you are pregnant, you will want to do everything you can to have a healthy baby. Two of the most important things are to get good prenatal care and to follow your health care provider's instructions. Prenatal care is all the medical care you receive before the baby's birth. This care will help prevent, find, and treat any problems during the pregnancy and childbirth. BODY CHANGES Your body goes through many changes during pregnancy. The changes vary from woman to woman.   You may gain or lose a couple of pounds at first.  You may feel sick to your stomach (nauseous) and throw up (vomit). If the vomiting is uncontrollable, call your health care provider.  You may tire easily.  You may develop headaches that can be relieved by medicines approved by your health care provider.  You may urinate more often. Painful urination may mean you have a bladder infection.  You may develop heartburn as a result of your pregnancy.  You may develop constipation because certain hormones are causing the muscles that push waste through your intestines to slow down.  You may develop hemorrhoids or swollen, bulging veins (varicose veins).  Your breasts may begin to grow larger and become tender. Your nipples may stick out more, and the tissue that surrounds them (areola) may become darker.  Your gums may bleed and may be sensitive to brushing and flossing.  Dark spots or blotches  (chloasma, mask of pregnancy) may develop on your face. This will likely fade after the baby is born.  Your menstrual periods will stop.  You may have a loss of appetite.  You may develop cravings for certain kinds of food.  You may have changes in your emotions from day to day, such as being excited to be pregnant or being concerned that something may go wrong with the pregnancy and baby.  You may have more vivid and strange dreams.  You may have changes in your hair. These can include thickening of your hair, rapid growth, and changes in texture. Some women also have hair loss during or after pregnancy, or hair that feels dry or thin. Your hair will most likely return to normal after your baby is born. WHAT TO EXPECT AT YOUR PRENATAL VISITS During a routine prenatal visit:  You will be weighed to make sure you and the baby are growing normally.  Your blood pressure will be taken.  Your abdomen will be measured to track your baby's growth.  The fetal heartbeat will be listened to starting around week 10 or 12 of your pregnancy.  Test results from any previous visits will be discussed. Your health care provider may ask you:  How you are feeling.  If you are feeling the baby move.  If you have had any abnormal symptoms, such as leaking fluid, bleeding, severe headaches, or abdominal cramping.  If you have any questions. Other   tests that may be performed during your first trimester include:  Blood tests to find your blood type and to check for the presence of any previous infections. They will also be used to check for low iron levels (anemia) and Rh antibodies. Later in the pregnancy, blood tests for diabetes will be done along with other tests if problems develop.  Urine tests to check for infections, diabetes, or protein in the urine.  An ultrasound to confirm the proper growth and development of the baby.  An amniocentesis to check for possible genetic problems.  Fetal  screens for spina bifida and Down syndrome.  You may need other tests to make sure you and the baby are doing well. HOME CARE INSTRUCTIONS  Medicines  Follow your health care provider's instructions regarding medicine use. Specific medicines may be either safe or unsafe to take during pregnancy.  Take your prenatal vitamins as directed.  If you develop constipation, try taking a stool softener if your health care provider approves. Diet  Eat regular, well-balanced meals. Choose a variety of foods, such as meat or vegetable-based protein, fish, milk and low-fat dairy products, vegetables, fruits, and whole grain breads and cereals. Your health care provider will help you determine the amount of weight gain that is right for you.  Avoid raw meat and uncooked cheese. These carry germs that can cause birth defects in the baby.  Eating four or five small meals rather than three large meals a day may help relieve nausea and vomiting. If you start to feel nauseous, eating a few soda crackers can be helpful. Drinking liquids between meals instead of during meals also seems to help nausea and vomiting.  If you develop constipation, eat more high-fiber foods, such as fresh vegetables or fruit and whole grains. Drink enough fluids to keep your urine clear or pale yellow. Activity and Exercise  Exercise only as directed by your health care provider. Exercising will help you:  Control your weight.  Stay in shape.  Be prepared for labor and delivery.  Experiencing pain or cramping in the lower abdomen or low back is a good sign that you should stop exercising. Check with your health care provider before continuing normal exercises.  Try to avoid standing for long periods of time. Move your legs often if you must stand in one place for a long time.  Avoid heavy lifting.  Wear low-heeled shoes, and practice good posture.  You may continue to have sex unless your health care provider directs you  otherwise. Relief of Pain or Discomfort  Wear a good support bra for breast tenderness.   Take warm sitz baths to soothe any pain or discomfort caused by hemorrhoids. Use hemorrhoid cream if your health care provider approves.   Rest with your legs elevated if you have leg cramps or low back pain.  If you develop varicose veins in your legs, wear support hose. Elevate your feet for 15 minutes, 3-4 times a day. Limit salt in your diet. Prenatal Care  Schedule your prenatal visits by the twelfth week of pregnancy. They are usually scheduled monthly at first, then more often in the last 2 months before delivery.  Write down your questions. Take them to your prenatal visits.  Keep all your prenatal visits as directed by your health care provider. Safety  Wear your seat belt at all times when driving.  Make a list of emergency phone numbers, including numbers for family, friends, the hospital, and police and fire departments. General   Tips  Ask your health care provider for a referral to a local prenatal education class. Begin classes no later than at the beginning of month 6 of your pregnancy.  Ask for help if you have counseling or nutritional needs during pregnancy. Your health care provider can offer advice or refer you to specialists for help with various needs.  Do not use hot tubs, steam rooms, or saunas.  Do not douche or use tampons or scented sanitary pads.  Do not cross your legs for long periods of time.  Avoid cat litter boxes and soil used by cats. These carry germs that can cause birth defects in the baby and possibly loss of the fetus by miscarriage or stillbirth.  Avoid all smoking, herbs, alcohol, and medicines not prescribed by your health care provider. Chemicals in these affect the formation and growth of the baby.  Schedule a dentist appointment. At home, brush your teeth with a soft toothbrush and be gentle when you floss. SEEK MEDICAL CARE IF:   You have  dizziness.  You have mild pelvic cramps, pelvic pressure, or nagging pain in the abdominal area.  You have persistent nausea, vomiting, or diarrhea.  You have a bad smelling vaginal discharge.  You have pain with urination.  You notice increased swelling in your face, hands, legs, or ankles. SEEK IMMEDIATE MEDICAL CARE IF:   You have a fever.  You are leaking fluid from your vagina.  You have spotting or bleeding from your vagina.  You have severe abdominal cramping or pain.  You have rapid weight gain or loss.  You vomit blood or material that looks like coffee grounds.  You are exposed to German measles and have never had them.  You are exposed to fifth disease or chickenpox.  You develop a severe headache.  You have shortness of breath.  You have any kind of trauma, such as from a fall or a car accident. Document Released: 10/24/2001 Document Revised: 03/16/2014 Document Reviewed: 09/09/2013 [employer] Patient Information 2015 [employer]. This information is not intended to replace advice given to you by your health care provider. Make sure you discuss any questions you have with your health care provider.   Nausea & Vomiting  Have saltine crackers or pretzels by your bed and eat a few bites before you raise your head out of bed in the morning  Eat small frequent meals throughout the day instead of large meals  Drink plenty of fluids throughout the day to stay hydrated, just don't drink a lot of fluids with your meals.  This can make your stomach fill up faster making you feel sick  Do not brush your teeth right after you eat  Products with real ginger are good for nausea, like ginger ale and ginger hard candy Make sure it says made with real ginger!  Sucking on sour candy like lemon heads is also good for nausea  If your prenatal vitamins make you nauseated, take them at night so you will sleep through the nausea  Sea Bands  If you feel like you need  medicine for the nausea & vomiting please let us know  If you are unable to keep any fluids or food down please let us know   Constipation  Drink plenty of fluid, preferably water, throughout the day  Eat foods high in fiber such as fruits, vegetables, and grains  Exercise, such as walking, is a good way to keep your bowels regular  Drink warm fluids, especially warm   prune juice, or decaf coffee  Eat a 1/2 cup of real oatmeal (not instant), 1/2 cup applesauce, and 1/2-1 cup warm prune juice every day  If needed, you may take Colace (docusate sodium) stool softener once or twice a day to help keep the stool soft. If you are pregnant, wait until you are out of your first trimester (12-14 weeks of pregnancy)  If you still are having problems with constipation, you may take Miralax once daily as needed to help keep your bowels regular.  If you are pregnant, wait until you are out of your first trimester (12-14 weeks of pregnancy)  Safe Medications in Pregnancy   Acne: Benzoyl Peroxide Salicylic Acid  Backache/Headache: Tylenol: 2 regular strength every 4 hours OR              2 Extra strength every 6 hours  Colds/Coughs/Allergies: Benadryl (alcohol free) 25 mg every 6 hours as needed Breath right strips Claritin Cepacol throat lozenges Chloraseptic throat spray Cold-Eeze- up to three times per day Cough drops, alcohol free Flonase (by prescription only) Guaifenesin Mucinex Robitussin DM (plain only, alcohol free) Saline nasal spray/drops Sudafed (pseudoephedrine) & Actifed ** use only after [redacted] weeks gestation and if you do not have high blood pressure Tylenol Vicks Vaporub Zinc lozenges Zyrtec   Constipation: Colace Ducolax suppositories Fleet enema Glycerin suppositories Metamucil Milk of magnesia Miralax Senokot Smooth move tea  Diarrhea: Kaopectate Imodium A-D  *NO pepto Bismol  Hemorrhoids: Anusol Anusol HC Preparation  H Tucks  Indigestion: Tums Maalox Mylanta Zantac  Pepcid  Insomnia: Benadryl (alcohol free) 25mg every 6 hours as needed Tylenol PM Unisom, no Gelcaps  Leg Cramps: Tums MagGel  Nausea/Vomiting:  Bonine Dramamine Emetrol Ginger extract Sea bands Meclizine  Nausea medication to take during pregnancy:  Unisom (doxylamine succinate 25 mg tablets) Take one tablet daily at bedtime. If symptoms are not adequately controlled, the dose can be increased to a maximum recommended dose of two tablets daily (1/2 tablet in the morning, 1/2 tablet mid-afternoon and one at bedtime). Vitamin B6 100mg tablets. Take one tablet twice a day (up to 200 mg per day).  Skin Rashes: Aveeno products Benadryl cream or 25mg every 6 hours as needed Calamine Lotion 1% cortisone cream  Yeast infection: Gyne-lotrimin 7 Monistat 7   **If taking multiple medications, please check labels to avoid duplicating the same active ingredients **take medication as directed on the label ** Do not exceed 4000 mg of tylenol in 24 hours **Do not take medications that contain aspirin or ibuprofen      

## 2017-07-06 ENCOUNTER — Encounter: Payer: Self-pay | Admitting: *Deleted

## 2017-07-06 ENCOUNTER — Telehealth: Payer: Self-pay | Admitting: *Deleted

## 2017-07-06 LAB — PMP SCREEN PROFILE (10S), URINE
AMPHETAMINE SCREEN URINE: NEGATIVE ng/mL
BARBITURATE SCREEN URINE: NEGATIVE ng/mL
BENZODIAZEPINE SCREEN, URINE: NEGATIVE ng/mL
CANNABINOIDS UR QL SCN: NEGATIVE ng/mL
CREATININE(CRT), U: 143.8 mg/dL (ref 20.0–300.0)
Cocaine (Metab) Scrn, Ur: NEGATIVE ng/mL
Methadone Screen, Urine: NEGATIVE ng/mL
OXYCODONE+OXYMORPHONE UR QL SCN: NEGATIVE ng/mL
Opiate Scrn, Ur: NEGATIVE ng/mL
PH UR, DRUG SCRN: 6.9 (ref 4.5–8.9)
PHENCYCLIDINE QUANTITATIVE URINE: NEGATIVE ng/mL
PROPOXYPHENE SCREEN URINE: NEGATIVE ng/mL

## 2017-07-06 LAB — COMPREHENSIVE METABOLIC PANEL
A/G RATIO: 1.5 (ref 1.2–2.2)
ALT: 8 IU/L (ref 0–32)
AST: 18 IU/L (ref 0–40)
Albumin: 4.1 g/dL (ref 3.5–5.5)
Alkaline Phosphatase: 55 IU/L (ref 39–117)
BILIRUBIN TOTAL: 0.3 mg/dL (ref 0.0–1.2)
BUN/Creatinine Ratio: 9 (ref 9–23)
BUN: 5 mg/dL — ABNORMAL LOW (ref 6–20)
CHLORIDE: 103 mmol/L (ref 96–106)
CO2: 18 mmol/L — ABNORMAL LOW (ref 20–29)
Calcium: 9.1 mg/dL (ref 8.7–10.2)
Creatinine, Ser: 0.54 mg/dL — ABNORMAL LOW (ref 0.57–1.00)
GFR calc non Af Amer: 130 mL/min/{1.73_m2} (ref >59)
GFR, EST AFRICAN AMERICAN: 150 mL/min/{1.73_m2} (ref >59)
Globulin, Total: 2.7 g/dL (ref 1.5–4.5)
Glucose: 73 mg/dL (ref 65–99)
POTASSIUM: 4.6 mmol/L (ref 3.5–5.2)
SODIUM: 136 mmol/L (ref 134–144)
TOTAL PROTEIN: 6.8 g/dL (ref 6.0–8.5)

## 2017-07-06 LAB — MED LIST OPTION NOT SELECTED

## 2017-07-06 NOTE — Telephone Encounter (Signed)
Called to follow-up regarding note for school. Advised to call back or send mychart message  for clarification.

## 2017-07-07 LAB — URINE CULTURE: ORGANISM ID, BACTERIA: NO GROWTH

## 2017-07-07 LAB — GC/CHLAMYDIA PROBE AMP
Chlamydia trachomatis, NAA: NEGATIVE
NEISSERIA GONORRHOEAE BY PCR: NEGATIVE

## 2017-07-11 LAB — MATERNAL SCREEN, INTEGRATED #1
Crown Rump Length: 68.2 mm
Gest. Age on Collection Date: 12.9 weeks
Maternal Age at EDD: 27.7 yr
Nuchal Translucency (NT): 2.1 mm
Number of Fetuses: 1
PAPP-A VALUE: 4927.7 ng/mL
Weight: 170 [lb_av]

## 2017-07-13 LAB — URINALYSIS, ROUTINE W REFLEX MICROSCOPIC
BILIRUBIN UA: NEGATIVE
Glucose, UA: NEGATIVE
KETONES UA: NEGATIVE
LEUKOCYTES UA: NEGATIVE
NITRITE UA: NEGATIVE
Protein, UA: NEGATIVE
RBC UA: NEGATIVE
SPEC GRAV UA: 1.019 (ref 1.005–1.030)
Urobilinogen, Ur: 0.2 mg/dL (ref 0.2–1.0)
pH, UA: 7 (ref 5.0–7.5)

## 2017-07-13 LAB — CYSTIC FIBROSIS MUTATION 97: GENE DIS ANAL CARRIER INTERP BLD/T-IMP: NOT DETECTED

## 2017-07-13 LAB — RPR: RPR: NONREACTIVE

## 2017-07-13 LAB — ABO/RH: RH TYPE: NEGATIVE

## 2017-07-13 LAB — HIV ANTIBODY (ROUTINE TESTING W REFLEX): HIV Screen 4th Generation wRfx: NONREACTIVE

## 2017-07-13 LAB — CBC
HEMOGLOBIN: 11.4 g/dL (ref 11.1–15.9)
Hematocrit: 36.7 % (ref 34.0–46.6)
MCH: 22.8 pg — AB (ref 26.6–33.0)
MCHC: 31.1 g/dL — AB (ref 31.5–35.7)
MCV: 74 fL — ABNORMAL LOW (ref 79–97)
Platelets: 318 10*3/uL (ref 150–379)
RBC: 4.99 x10E6/uL (ref 3.77–5.28)
RDW: 14.9 % (ref 12.3–15.4)
WBC: 7.3 10*3/uL (ref 3.4–10.8)

## 2017-07-13 LAB — HEPATITIS B SURFACE ANTIGEN: Hepatitis B Surface Ag: NEGATIVE

## 2017-07-13 LAB — RUBELLA SCREEN: RUBELLA: 4.52 {index} (ref >0.99)

## 2017-07-13 LAB — VARICELLA ZOSTER ANTIBODY, IGG: Varicella zoster IgG: 875 index (ref >165)

## 2017-07-13 LAB — SICKLE CELL SCREEN: Sickle Cell Screen: NEGATIVE

## 2017-07-13 LAB — ANTIBODY SCREEN: Antibody Screen: NEGATIVE

## 2017-07-15 ENCOUNTER — Encounter: Payer: Self-pay | Admitting: Advanced Practice Midwife

## 2017-07-26 ENCOUNTER — Encounter: Payer: Self-pay | Admitting: Advanced Practice Midwife

## 2017-07-26 ENCOUNTER — Ambulatory Visit (INDEPENDENT_AMBULATORY_CARE_PROVIDER_SITE_OTHER): Payer: Medicaid Other | Admitting: Advanced Practice Midwife

## 2017-07-26 VITALS — BP 102/68 | HR 65 | Wt 164.0 lb

## 2017-07-26 DIAGNOSIS — Z1389 Encounter for screening for other disorder: Secondary | ICD-10-CM

## 2017-07-26 DIAGNOSIS — Z331 Pregnant state, incidental: Secondary | ICD-10-CM

## 2017-07-26 DIAGNOSIS — Z3482 Encounter for supervision of other normal pregnancy, second trimester: Secondary | ICD-10-CM

## 2017-07-26 DIAGNOSIS — Z3A16 16 weeks gestation of pregnancy: Secondary | ICD-10-CM

## 2017-07-26 DIAGNOSIS — Z1379 Encounter for other screening for genetic and chromosomal anomalies: Secondary | ICD-10-CM

## 2017-07-26 DIAGNOSIS — Z363 Encounter for antenatal screening for malformations: Secondary | ICD-10-CM

## 2017-07-26 LAB — POCT URINALYSIS DIPSTICK
Blood, UA: NEGATIVE
Glucose, UA: NEGATIVE
KETONES UA: NEGATIVE
Nitrite, UA: NEGATIVE
PROTEIN UA: NEGATIVE

## 2017-07-26 NOTE — Patient Instructions (Addendum)
Second Trimester of Pregnancy The second trimester is from week 14 through week 27 (months 4 through 6). The second trimester is often a time when you feel your best. Your body has adjusted to being pregnant, and you begin to feel better physically. Usually, morning sickness has lessened or quit completely, you may have more energy, and you may have an increase in appetite. The second trimester is also a time when the fetus is growing rapidly. At the end of the sixth month, the fetus is about 9 inches long and weighs about 1 pounds. You will likely begin to feel the baby move (quickening) between 16 and 20 weeks of pregnancy. Body changes during your second trimester Your body continues to go through many changes during your second trimester. The changes vary from woman to woman.  Your weight will continue to increase. You will notice your lower abdomen bulging out.  You may begin to get stretch marks on your hips, abdomen, and breasts.  You may develop headaches that can be relieved by medicines. The medicines should be approved by your health care provider.  You may urinate more often because the fetus is pressing on your bladder.  You may develop or continue to have heartburn as a result of your pregnancy.  You may develop constipation because certain hormones are causing the muscles that push waste through your intestines to slow down.  You may develop hemorrhoids or swollen, bulging veins (varicose veins).  You may have back pain. This is caused by: ? Weight gain. ? Pregnancy hormones that are relaxing the joints in your pelvis. ? A shift in weight and the muscles that support your balance.  Your breasts will continue to grow and they will continue to become tender.  Your gums may bleed and may be sensitive to brushing and flossing.  Dark spots or blotches (chloasma, mask of pregnancy) may develop on your face. This will likely fade after the baby is born.  A dark line from your  belly button to the pubic area (linea nigra) may appear. This will likely fade after the baby is born.  You may have changes in your hair. These can include thickening of your hair, rapid growth, and changes in texture. Some women also have hair loss during or after pregnancy, or hair that feels dry or thin. Your hair will most likely return to normal after your baby is born.  What to expect at prenatal visits During a routine prenatal visit:  You will be weighed to make sure you and the fetus are growing normally.  Your blood pressure will be taken.  Your abdomen will be measured to track your baby's growth.  The fetal heartbeat will be listened to.  Any test results from the previous visit will be discussed.  Your health care provider may ask you:  How you are feeling.  If you are feeling the baby move.  If you have had any abnormal symptoms, such as leaking fluid, bleeding, severe headaches, or abdominal cramping.  If you are using any tobacco products, including cigarettes, chewing tobacco, and electronic cigarettes.  If you have any questions.  Other tests that may be performed during your second trimester include:  Blood tests that check for: ? Low iron levels (anemia). ? High blood sugar that affects pregnant women (gestational diabetes) between 24 and 28 weeks. ? Rh antibodies. This is to check for a protein on red blood cells (Rh factor).  Urine tests to check for infections, diabetes, or   protein in the urine.  An ultrasound to confirm the proper growth and development of the baby.  An amniocentesis to check for possible genetic problems.  Fetal screens for spina bifida and Down syndrome.  HIV (human immunodeficiency virus) testing. Routine prenatal testing includes screening for HIV, unless you choose not to have this test.  Follow these instructions at home: Medicines  Follow your health care provider's instructions regarding medicine use. Specific  medicines may be either safe or unsafe to take during pregnancy.  Take a prenatal vitamin that contains at least 600 micrograms (mcg) of folic acid.  If you develop constipation, try taking a stool softener if your health care provider approves. Eating and drinking  Eat a balanced diet that includes fresh fruits and vegetables, whole grains, good sources of protein such as meat, eggs, or tofu, and low-fat dairy. Your health care provider will help you determine the amount of weight gain that is right for you.  Avoid raw meat and uncooked cheese. These carry germs that can cause birth defects in the baby.  If you have low calcium intake from food, talk to your health care provider about whether you should take a daily calcium supplement.  Limit foods that are high in fat and processed sugars, such as fried and sweet foods.  To prevent constipation: ? Drink enough fluid to keep your urine clear or pale yellow. ? Eat foods that are high in fiber, such as fresh fruits and vegetables, whole grains, and beans. Activity  Exercise only as directed by your health care provider. Most women can continue their usual exercise routine during pregnancy. Try to exercise for 30 minutes at least 5 days a week. Stop exercising if you experience uterine contractions.  Avoid heavy lifting, wear low heel shoes, and practice good posture.  A sexual relationship may be continued unless your health care provider directs you otherwise. Relieving pain and discomfort  Wear a good support bra to prevent discomfort from breast tenderness.  Take warm sitz baths to soothe any pain or discomfort caused by hemorrhoids. Use hemorrhoid cream if your health care provider approves.  Rest with your legs elevated if you have leg cramps or low back pain.  If you develop varicose veins, wear support hose. Elevate your feet for 15 minutes, 3-4 times a day. Limit salt in your diet. Prenatal Care  Write down your questions.  Take them to your prenatal visits.  Keep all your prenatal visits as told by your health care provider. This is important. Safety  Wear your seat belt at all times when driving.  Make a list of emergency phone numbers, including numbers for family, friends, the hospital, and police and fire departments. General instructions  Ask your health care provider for a referral to a local prenatal education class. Begin classes no later than the beginning of month 6 of your pregnancy.  Ask for help if you have counseling or nutritional needs during pregnancy. Your health care provider can offer advice or refer you to specialists for help with various needs.  Do not use hot tubs, steam rooms, or saunas.  Do not douche or use tampons or scented sanitary pads.  Do not cross your legs for long periods of time.  Avoid cat litter boxes and soil used by cats. These carry germs that can cause birth defects in the baby and possibly loss of the fetus by miscarriage or stillbirth.  Avoid all smoking, herbs, alcohol, and unprescribed drugs. Chemicals in these products can   affect the formation and growth of the baby.  Do not use any products that contain nicotine or tobacco, such as cigarettes and e-cigarettes. If you need help quitting, ask your health care provider.  Visit your dentist if you have not gone yet during your pregnancy. Use a soft toothbrush to brush your teeth and be gentle when you floss. Contact a health care provider if:  You have dizziness.  You have mild pelvic cramps, pelvic pressure, or nagging pain in the abdominal area.  You have persistent nausea, vomiting, or diarrhea.  You have a bad smelling vaginal discharge.  You have pain when you urinate. Get help right away if:  You have a fever.  You are leaking fluid from your vagina.  You have spotting or bleeding from your vagina.  You have severe abdominal cramping or pain.  You have rapid weight gain or weight  loss.  You have shortness of breath with chest pain.  You notice sudden or extreme swelling of your face, hands, ankles, feet, or legs.  You have not felt your baby move in over an hour.  You have severe headaches that do not go away when you take medicine.  You have vision changes. Summary  The second trimester is from week 14 through week 27 (months 4 through 6). It is also a time when the fetus is growing rapidly.  Your body goes through many changes during pregnancy. The changes vary from woman to woman.  Avoid all smoking, herbs, alcohol, and unprescribed drugs. These chemicals affect the formation and growth your baby.  Do not use any tobacco products, such as cigarettes, chewing tobacco, and e-cigarettes. If you need help quitting, ask your health care provider.  Contact your health care provider if you have any questions. Keep all prenatal visits as told by your health care provider. This is important. This information is not intended to replace advice given to you by your health care provider. Make sure you discuss any questions you have with your health care provider. Document Released: 10/24/2001 Document Revised: 04/06/2016 Document Reviewed: 12/31/2012 Elsevier Interactive Patient Education  2017 [employer]? Guide for patients at [facility]  Why consider waterbirth?  . Gentle birth for babies . Less pain medicine used in labor . May allow for passive descent/less pushing . May reduce perineal tears  . More mobility and instinctive maternal position changes . Increased maternal relaxation . Reduced blood pressure in labor  Is waterbirth safe? What are the risks of infection, drowning or other complications?  . Infection: o Very low risk (3.7 % for tub vs 4.8% for bed) o 7 in 8000 waterbirths with documented infection o Poorly cleaned equipment most common cause o Slightly lower group B strep transmission  rate  . Drowning o Maternal:  - Very low risk   - Related to seizures or fainting o Newborn:  - Very low risk. No evidence of increased risk of respiratory problems in multiple large studies - Physiological protection from breathing under water - Avoid underwater birth if there are any fetal complications - Once baby's head is out of the water, keep it out.  . Birth complication o Some reports of cord trauma, but risk decreased by bringing baby to surface gradually o No evidence of increased risk of shoulder dystocia. Mothers can usually change positions faster in water than in a bed, possibly aiding the maneuvers to free the shoulder.   Am I a candidate for waterbirth?  Yes, if you are: . Full-term (37 weeks or greater)  . Have had an uncomplicated pregnancy and labor  No, if you have: Marland Kitchen Preterm birth less than 37 weeks . Thick, particulate meconium stained fluid . Maternal fever over 101 . Heavy bleeding or signs of placental abruption . Pre-eclampsia  . Any abnormal fetal heart rate pattern . Breech presentation . Twins  . Very large baby . Active communicable infection (this does NOT include group B strep) . Significant limitation to mobility  Please remember that birth is unpredictable. Under certain unforeseeable circumstances your provider may advise against giving birth in the tub. These decisions will be made on a case-by-case basis and with the safety of you and your baby as our highest priority.  Requirements for patients planning waterbirth  . Ask your midwife if you will be a candidate for waterbirth. . Attend the [facility] [place] [facility] at [phone] or [phone] for dates and times. The class is free and we strongly encourage you to bring your support person. You will receive a certificate of participation to show to your midwife or doctor. . Supplies needed for [facility] and [facility]  patients: o Single-use disposable tub liner (birthpoolinabox.com  REGULAR size) o New garden hose labeled "lead-free", "suitable for drinking water", "non-toxic" OR "water potable" o Garden hose to remove the dirty water o Faucet adaptor to attach hose to faucet         o Electric drain pump to remove water (We recommend 792 gallon per hour or greater pump.)  o Fish net o Bathing suit top (optional) o Long-handled mirror (optional)  [email or web address] sells tubs for $120 if you would rather purchase your own tub

## 2017-07-26 NOTE — Progress Notes (Signed)
[id number] [redacted]w[redacted]d Estimated Date of Delivery: 01/10/18  Blood pressure 102/68, pulse 65, weight 164 lb (74.4 kg), last menstrual period 04/05/2017.   BP weight and urine results all reviewed and noted.  Please refer to the obstetrical flow sheet for the fundal height and fetal heart rate documentation:  Doesn't think that she really weighed 170 at first visit.  Eats a lot, no way she lost weight.   PatienT denies any bleeding and no rupture of membranes symptoms or regular contractions. Patient is without complaints. All questions were answered.  Orders Placed This Encounter  Procedures  . US OB Comp + 14 Wk  . INTEGRATED 2  . POCT urinalysis dipstick    Plan:  Continued routine obstetrical care, 2nd IT  Return in about 4 weeks (around 08/23/2017) for [place], GI:[id number].

## 2017-07-27 LAB — PROTEIN, URINE, 24 HOUR
PROTEIN 24H UR: 244 mg/(24.h) — AB (ref 30–150)
Protein, Ur: 12.5 mg/dL

## 2017-07-31 ENCOUNTER — Encounter: Payer: Self-pay | Admitting: Advanced Practice Midwife

## 2017-08-01 LAB — INTEGRATED 2
AFP MARKER: 60.1 ng/mL
AFP MoM: 1.91
Crown Rump Length: 68.2 mm
DIA MOM: 2.15
DIA Value: 346.8 pg/mL
Estriol, Unconjugated: 1.67 ng/mL
Gest. Age on Collection Date: 12.9 weeks
Gestational Age: 15.9 weeks
HCG MOM: 2.94
Maternal Age at EDD: 27.7 yr
NUMBER OF FETUSES: 1
Nuchal Translucency (NT): 2.1 mm
Nuchal Translucency MoM: 1.24
PAPP-A MOM: 5.37
PAPP-A Value: 4927.7 ng/mL
Test Results:: NEGATIVE
WEIGHT: 164 [lb_av]
Weight: 170 [lb_av]
hCG Value: 98.4 IU/mL
uE3 MoM: 2.31

## 2017-08-24 ENCOUNTER — Ambulatory Visit (INDEPENDENT_AMBULATORY_CARE_PROVIDER_SITE_OTHER): Payer: Medicaid Other

## 2017-08-24 ENCOUNTER — Ambulatory Visit (INDEPENDENT_AMBULATORY_CARE_PROVIDER_SITE_OTHER): Payer: Medicaid Other | Admitting: Obstetrics and Gynecology

## 2017-08-24 ENCOUNTER — Encounter: Payer: Self-pay | Admitting: Obstetrics and Gynecology

## 2017-08-24 VITALS — BP 120/70 | HR 74 | Wt 167.8 lb

## 2017-08-24 DIAGNOSIS — Z3482 Encounter for supervision of other normal pregnancy, second trimester: Secondary | ICD-10-CM

## 2017-08-24 DIAGNOSIS — Z3402 Encounter for supervision of normal first pregnancy, second trimester: Secondary | ICD-10-CM

## 2017-08-24 DIAGNOSIS — Z3A20 20 weeks gestation of pregnancy: Secondary | ICD-10-CM

## 2017-08-24 DIAGNOSIS — Z363 Encounter for antenatal screening for malformations: Secondary | ICD-10-CM

## 2017-08-24 DIAGNOSIS — Z331 Pregnant state, incidental: Secondary | ICD-10-CM

## 2017-08-24 DIAGNOSIS — Z1389 Encounter for screening for other disorder: Secondary | ICD-10-CM

## 2017-08-24 DIAGNOSIS — I10 Essential (primary) hypertension: Secondary | ICD-10-CM

## 2017-08-24 LAB — POCT URINALYSIS DIPSTICK
GLUCOSE UA: NEGATIVE
KETONES UA: NEGATIVE
Leukocytes, UA: NEGATIVE
Nitrite, UA: NEGATIVE
Protein, UA: NEGATIVE
RBC UA: NEGATIVE

## 2017-08-24 NOTE — Progress Notes (Addendum)
Kayla Franklin is a 26 y.o. female G3P0020  Estimated Date of Delivery: 01/10/18 LROB [redacted]w[redacted]d  No chief complaint on file. ____  Patient has no complaints. She report some RUQ abdominal pain. Patient reports good fetal movement,                          denies any bleeding , rupture of membranes,or regular contractions.  Last menstrual period 04/05/2017.   Urine results: negative  refer to the ob flow sheet for FH and FHR, ,                            Physical Examination: General appearance - alert, well appearing, and in no distress                                      Abdomen - FH 22 ,                                                         -FHR 148                                                         soft, nontender, nondistended, no masses or organomegaly                                      Pelvic - Not indicated ultrasound done TECHNICIAN COMMENTS:  [place] 20+1 wks,breech,cx 4.1 cm,post pl gr 0,normal ovaries bilat,mult fibroids (#1) posterior 2.9 x 2.7 x 3.2 cm (#2) fundal 7.8 x 4 x 6.1 cm,fhr 148 bpm,svp of fluid 4.6 cm,efw 328 g,anatomy complete,no obvious abnormalities       A copy of this report including all images has been saved and backed up to a second source for retrieval if needed. All measures and details of the anatomical scan, placentation, fluid volume and pelvic anatomy are contained in that report.  Kayla Franklin 08/24/2017                                            Questions were answered. Assessment: LROB G3P0020 @ [redacted]w[redacted]d Estimated Date of Delivery: 01/10/18,   Normal anatomic survey Plan:  Continued routine obstetrical care, LROB  F/u in 4 weeks for LROB    By signing my name below, I, Kayla Franklin, attest that this documentation has been prepared under the direction and in the presence of Jonnie Kind, MD. Electronically Signed: Jabier Gauss, Medical Scribe. 08/24/17. 12:23 PM.  I personally performed the services described in this  documentation, which was SCRIBED in my presence. The recorded information has been reviewed and considered accurate. It has been edited as necessary during review. Jonnie Kind, MD

## 2017-08-24 NOTE — Progress Notes (Signed)
[place] 20+1 wks,breech,cx 4.1 cm,post pl gr 0,normal ovaries bilat,mult fibroids (#1) posterior 2.9 x 2.7 x 3.2 cm (#2) fundal 7.8 x 4 x 6.1 cm,fhr 148 bpm,svp of fluid 4.6 cm,efw 328 g,anatomy complete,no obvious abnormalities

## 2017-08-30 ENCOUNTER — Encounter: Payer: Self-pay | Admitting: Advanced Practice Midwife

## 2017-09-21 ENCOUNTER — Ambulatory Visit (INDEPENDENT_AMBULATORY_CARE_PROVIDER_SITE_OTHER): Payer: Medicaid Other | Admitting: Obstetrics & Gynecology

## 2017-09-21 ENCOUNTER — Encounter: Payer: Self-pay | Admitting: Obstetrics & Gynecology

## 2017-09-21 VITALS — BP 112/64 | HR 85 | Wt 171.0 lb

## 2017-09-21 DIAGNOSIS — Z331 Pregnant state, incidental: Secondary | ICD-10-CM

## 2017-09-21 DIAGNOSIS — Z3A24 24 weeks gestation of pregnancy: Secondary | ICD-10-CM

## 2017-09-21 DIAGNOSIS — Z1389 Encounter for screening for other disorder: Secondary | ICD-10-CM

## 2017-09-21 DIAGNOSIS — Z3482 Encounter for supervision of other normal pregnancy, second trimester: Secondary | ICD-10-CM

## 2017-09-21 LAB — POCT URINALYSIS DIPSTICK
Blood, UA: NEGATIVE
GLUCOSE UA: NEGATIVE
KETONES UA: NEGATIVE
Leukocytes, UA: NEGATIVE
Nitrite, UA: NEGATIVE
PROTEIN UA: NEGATIVE

## 2017-09-21 NOTE — Progress Notes (Signed)
[id number] [redacted]w[redacted]d Estimated Date of Delivery: 01/10/18  Blood pressure 112/64, pulse 85, weight 171 lb (77.6 kg), last menstrual period 04/05/2017.   BP weight and urine results all reviewed and noted.  Please refer to the obstetrical flow sheet for the fundal height and fetal heart rate documentation:  Patient reports good fetal movement, denies any bleeding and no rupture of membranes symptoms or regular contractions. Patient is without complaints. All questions were answered.  Orders Placed This Encounter  Procedures  . POCT urinalysis dipstick    Plan:  Continued routine obstetrical care,   Return in about 4 weeks (around 10/19/2017) for PN2, , LROB.

## 2017-10-19 ENCOUNTER — Other Ambulatory Visit: Payer: Medicaid Other

## 2017-10-19 ENCOUNTER — Ambulatory Visit (INDEPENDENT_AMBULATORY_CARE_PROVIDER_SITE_OTHER): Payer: Medicaid Other | Admitting: Obstetrics & Gynecology

## 2017-10-19 ENCOUNTER — Encounter: Payer: Self-pay | Admitting: Obstetrics & Gynecology

## 2017-10-19 VITALS — BP 130/70 | HR 85 | Wt 177.5 lb

## 2017-10-19 DIAGNOSIS — Z131 Encounter for screening for diabetes mellitus: Secondary | ICD-10-CM

## 2017-10-19 DIAGNOSIS — Z3A28 28 weeks gestation of pregnancy: Secondary | ICD-10-CM

## 2017-10-19 DIAGNOSIS — Z331 Pregnant state, incidental: Secondary | ICD-10-CM

## 2017-10-19 DIAGNOSIS — Z1389 Encounter for screening for other disorder: Secondary | ICD-10-CM

## 2017-10-19 DIAGNOSIS — Z3482 Encounter for supervision of other normal pregnancy, second trimester: Secondary | ICD-10-CM

## 2017-10-19 DIAGNOSIS — Z3483 Encounter for supervision of other normal pregnancy, third trimester: Secondary | ICD-10-CM

## 2017-10-19 LAB — POCT URINALYSIS DIPSTICK
GLUCOSE UA: NEGATIVE
Ketones, UA: NEGATIVE
Leukocytes, UA: NEGATIVE
NITRITE UA: NEGATIVE
Protein, UA: NEGATIVE
RBC UA: NEGATIVE

## 2017-10-19 NOTE — Progress Notes (Signed)
[id number] [redacted]w[redacted]d Estimated Date of Delivery: 01/10/18  Blood pressure 130/70, pulse 85, weight 177 lb 8 oz (80.5 kg), last menstrual period 04/05/2017.   BP weight and urine results all reviewed and noted.  Please refer to the obstetrical flow sheet for the fundal height and fetal heart rate documentation:  Patient reports good fetal movement, denies any bleeding and no rupture of membranes symptoms or regular contractions. Patient is without complaints. All questions were answered.  Orders Placed This Encounter  Procedures  . US OB Follow Up  . 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

## 2017-10-20 LAB — CBC
Hematocrit: 30.2 % — ABNORMAL LOW (ref 34.0–46.6)
Hemoglobin: 9.2 g/dL — ABNORMAL LOW (ref 11.1–15.9)
MCH: 22.7 pg — AB (ref 26.6–33.0)
MCHC: 30.5 g/dL — ABNORMAL LOW (ref 31.5–35.7)
MCV: 74 fL — ABNORMAL LOW (ref 79–97)
PLATELETS: 272 10*3/uL (ref 150–379)
RBC: 4.06 x10E6/uL (ref 3.77–5.28)
RDW: 14.9 % (ref 12.3–15.4)
WBC: 9 10*3/uL (ref 3.4–10.8)

## 2017-10-20 LAB — GLUCOSE TOLERANCE, 2 HOURS W/ 1HR
GLUCOSE, FASTING: 74 mg/dL (ref 65–91)
Glucose, 1 hour: 149 mg/dL (ref 65–179)
Glucose, 2 hour: 128 mg/dL (ref 65–152)

## 2017-10-20 LAB — ANTIBODY SCREEN: Antibody Screen: NEGATIVE

## 2017-10-20 LAB — RPR: RPR: NONREACTIVE

## 2017-10-20 LAB — HIV ANTIBODY (ROUTINE TESTING W REFLEX): HIV SCREEN 4TH GENERATION: NONREACTIVE

## 2017-10-26 ENCOUNTER — Encounter: Payer: Self-pay | Admitting: Advanced Practice Midwife

## 2017-11-09 ENCOUNTER — Other Ambulatory Visit: Payer: Self-pay | Admitting: Obstetrics & Gynecology

## 2017-11-09 DIAGNOSIS — D259 Leiomyoma of uterus, unspecified: Secondary | ICD-10-CM

## 2017-11-09 DIAGNOSIS — O3410 Maternal care for benign tumor of corpus uteri, unspecified trimester: Principal | ICD-10-CM

## 2017-11-12 ENCOUNTER — Other Ambulatory Visit: Payer: Self-pay

## 2017-11-12 ENCOUNTER — Ambulatory Visit (INDEPENDENT_AMBULATORY_CARE_PROVIDER_SITE_OTHER): Payer: Medicaid Other | Admitting: Women's Health

## 2017-11-12 ENCOUNTER — Ambulatory Visit: Payer: Medicaid Other

## 2017-11-12 ENCOUNTER — Encounter: Payer: Self-pay | Admitting: Women's Health

## 2017-11-12 VITALS — BP 112/68 | HR 86 | Wt 182.0 lb

## 2017-11-12 DIAGNOSIS — Z8679 Personal history of other diseases of the circulatory system: Secondary | ICD-10-CM

## 2017-11-12 DIAGNOSIS — O360130 Maternal care for anti-D [Rh] antibodies, third trimester, not applicable or unspecified: Secondary | ICD-10-CM | POA: Diagnosis not present

## 2017-11-12 DIAGNOSIS — O3413 Maternal care for benign tumor of corpus uteri, third trimester: Secondary | ICD-10-CM

## 2017-11-12 DIAGNOSIS — O26899 Other specified pregnancy related conditions, unspecified trimester: Secondary | ICD-10-CM

## 2017-11-12 DIAGNOSIS — D649 Anemia, unspecified: Secondary | ICD-10-CM

## 2017-11-12 DIAGNOSIS — Z1389 Encounter for screening for other disorder: Secondary | ICD-10-CM

## 2017-11-12 DIAGNOSIS — Z3A31 31 weeks gestation of pregnancy: Secondary | ICD-10-CM | POA: Diagnosis not present

## 2017-11-12 DIAGNOSIS — O99013 Anemia complicating pregnancy, third trimester: Secondary | ICD-10-CM

## 2017-11-12 DIAGNOSIS — Z6791 Unspecified blood type, Rh negative: Secondary | ICD-10-CM | POA: Insufficient documentation

## 2017-11-12 DIAGNOSIS — Z331 Pregnant state, incidental: Secondary | ICD-10-CM

## 2017-11-12 DIAGNOSIS — O3410 Maternal care for benign tumor of corpus uteri, unspecified trimester: Secondary | ICD-10-CM

## 2017-11-12 DIAGNOSIS — Z3483 Encounter for supervision of other normal pregnancy, third trimester: Secondary | ICD-10-CM

## 2017-11-12 DIAGNOSIS — D259 Leiomyoma of uterus, unspecified: Secondary | ICD-10-CM | POA: Insufficient documentation

## 2017-11-12 LAB — POCT URINALYSIS DIPSTICK
GLUCOSE UA: NEGATIVE
Ketones, UA: NEGATIVE
LEUKOCYTES UA: NEGATIVE
NITRITE UA: NEGATIVE
PROTEIN UA: NEGATIVE
RBC UA: NEGATIVE

## 2017-11-12 NOTE — Patient Instructions (Addendum)
Kayla Franklin, I greatly value your feedback.  If you receive a survey following your visit with [place] today, we appreciate you taking the time to fill it out.  Thanks, Knute Neu, CNM, WHNP-BC   Call the office [phone]) or go to [facility] if:  You begin to have strong, frequent contractions  Your water breaks.  Sometimes it is a big gush of fluid, sometimes it is just a trickle that keeps getting your panties wet or running down your legs  You have vaginal bleeding.  It is normal to have a small amount of spotting if your cervix was checked.   You don't feel your baby moving like normal.  If you don't, get you something to eat and drink and lay down and focus on feeling your baby move.  You should feel at least 10 movements in 2 hours.  If you don't, you should call the office or go to [facility].    Tdap Vaccine  It is recommended that you get the Tdap vaccine during the third trimester of EACH pregnancy to help protect your baby from getting pertussis (whooping cough)  27-36 weeks is the BEST time to do this so that you can pass the protection on to your baby. During pregnancy is better than after pregnancy, but if you are unable to get it during pregnancy it will be offered at the hospital.   You can get this vaccine at the health department or your family doctor  Everyone who will be around your baby should also be up-to-date on their vaccines. Adults (who are not pregnant) only need 1 dose of Tdap during adulthood.   Third Trimester of Pregnancy The third trimester is from week 29 through week 42, months 7 through 9. The third trimester is a time when the fetus is growing rapidly. At the end of the ninth month, the fetus is about 20 inches in length and weighs 6-10 pounds.  BODY CHANGES Your body goes through many changes during pregnancy. The changes vary from woman to woman.   Your weight will continue to increase. You can expect to gain 25-35 pounds (11-16 kg) by  the end of the pregnancy.  You may begin to get stretch marks on your hips, abdomen, and breasts.  You may urinate more often because the fetus is moving lower into your pelvis and pressing on your bladder.  You may develop or continue to have heartburn as a result of your pregnancy.  You may develop constipation because certain hormones are causing the muscles that push waste through your intestines to slow down.  You may develop hemorrhoids or swollen, bulging veins (varicose veins).  You may have pelvic pain because of the weight gain and pregnancy hormones relaxing your joints between the bones in your pelvis. Backaches may result from overexertion of the muscles supporting your posture.  You may have changes in your hair. These can include thickening of your hair, rapid growth, and changes in texture. Some women also have hair loss during or after pregnancy, or hair that feels dry or thin. Your hair will most likely return to normal after your baby is born.  Your breasts will continue to grow and be tender. A yellow discharge may leak from your breasts called colostrum.  Your belly button may stick out.  You may feel short of breath because of your expanding uterus.  You may notice the fetus "dropping," or moving lower in your abdomen.  You may have a bloody mucus  discharge. This usually occurs a few days to a week before labor begins.  Your cervix becomes thin and soft (effaced) near your due date. WHAT TO EXPECT AT YOUR PRENATAL EXAMS  You will have prenatal exams every 2 weeks until week 36. Then, you will have weekly prenatal exams. During a routine prenatal visit:  You will be weighed to make sure you and the fetus are growing normally.  Your blood pressure is taken.  Your abdomen will be measured to track your baby's growth.  The fetal heartbeat will be listened to.  Any test results from the previous visit will be discussed.  You may have a cervical check near your  due date to see if you have effaced. At around 36 weeks, your caregiver will check your cervix. At the same time, your caregiver will also perform a test on the secretions of the vaginal tissue. This test is to determine if a type of bacteria, Group B streptococcus, is present. Your caregiver will explain this further. Your caregiver may ask you:  What your birth plan is.  How you are feeling.  If you are feeling the baby move.  If you have had any abnormal symptoms, such as leaking fluid, bleeding, severe headaches, or abdominal cramping.  If you have any questions. Other tests or screenings that may be performed during your third trimester include:  Blood tests that check for low iron levels (anemia).  Fetal testing to check the health, activity level, and growth of the fetus. Testing is done if you have certain medical conditions or if there are problems during the pregnancy. FALSE LABOR You may feel small, irregular contractions that eventually go away. These are called Braxton Hicks contractions, or false labor. Contractions may last for hours, days, or even weeks before true labor sets in. If contractions come at regular intervals, intensify, or become painful, it is best to be seen by your caregiver.  SIGNS OF LABOR   Menstrual-like cramps.  Contractions that are 5 minutes apart or less.  Contractions that start on the top of the uterus and spread down to the lower abdomen and back.  A sense of increased pelvic pressure or back pain.  A watery or bloody mucus discharge that comes from the vagina. If you have any of these signs before the 37th week of pregnancy, call your caregiver right away. You need to go to the hospital to get checked immediately. HOME CARE INSTRUCTIONS   Avoid all smoking, herbs, alcohol, and unprescribed drugs. These chemicals affect the formation and growth of the baby.  Follow your caregiver's instructions regarding medicine use. There are medicines  that are either safe or unsafe to take during pregnancy.  Exercise only as directed by your caregiver. Experiencing uterine cramps is a good sign to stop exercising.  Continue to eat regular, healthy meals.  Wear a good support bra for breast tenderness.  Do not use hot tubs, steam rooms, or saunas.  Wear your seat belt at all times when driving.  Avoid raw meat, uncooked cheese, cat litter boxes, and soil used by cats. These carry germs that can cause birth defects in the baby.  Take your prenatal vitamins.  Try taking a stool softener (if your caregiver approves) if you develop constipation. Eat more high-fiber foods, such as fresh vegetables or fruit and whole grains. Drink plenty of fluids to keep your urine clear or pale yellow.  Take warm sitz baths to soothe any pain or discomfort caused by hemorrhoids. Use  hemorrhoid cream if your caregiver approves.  If you develop varicose veins, wear support hose. Elevate your feet for 15 minutes, 3-4 times a day. Limit salt in your diet.  Avoid heavy lifting, wear low heal shoes, and practice good posture.  Rest a lot with your legs elevated if you have leg cramps or low back pain.  Visit your dentist if you have not gone during your pregnancy. Use a soft toothbrush to brush your teeth and be gentle when you floss.  A sexual relationship may be continued unless your caregiver directs you otherwise.  Do not travel far distances unless it is absolutely necessary and only with the approval of your caregiver.  Take prenatal classes to understand, practice, and ask questions about the labor and delivery.  Make a trial run to the hospital.  Pack your hospital bag.  Prepare the baby's nursery.  Continue to go to all your prenatal visits as directed by your caregiver. SEEK MEDICAL CARE IF:  You are unsure if you are in labor or if your water has broken.  You have dizziness.  You have mild pelvic cramps, pelvic pressure, or nagging  pain in your abdominal area.  You have persistent nausea, vomiting, or diarrhea.  You have a bad smelling vaginal discharge.  You have pain with urination. SEEK IMMEDIATE MEDICAL CARE IF:   You have a fever.  You are leaking fluid from your vagina.  You have spotting or bleeding from your vagina.  You have severe abdominal cramping or pain.  You have rapid weight loss or gain.  You have shortness of breath with chest pain.  You notice sudden or extreme swelling of your face, hands, ankles, feet, or legs.  You have not felt your baby move in over an hour.  You have severe headaches that do not go away with medicine.  You have vision changes. Document Released: 10/24/2001 Document Revised: 11/04/2013 Document Reviewed: 12/31/2012 [facility] Patient Information 2015 [place], [place]. This information is not intended to replace advice given to you by your health care provider. Make sure you discuss any questions you have with your health care provider.   Iron-Rich Diet Iron is a mineral that helps your body to produce hemoglobin. Hemoglobin is a protein in your red blood cells that carries oxygen to your body's tissues. Eating too little iron may cause you to feel weak and tired, and it can increase your risk for infection. Eating enough iron is necessary for your body's metabolism, muscle function, and nervous system. Iron is naturally found in many foods. It can also be added to foods or fortified in foods. There are two types of dietary iron:  Heme iron. Heme iron is absorbed by the body more easily than nonheme iron. Heme iron is found in meat, poultry, and fish.  Nonheme iron. Nonheme iron is found in dietary supplements, iron-fortified grains, beans, and vegetables.  You may need to follow an iron-rich diet if:  You have been diagnosed with iron deficiency or iron-deficiency anemia.  You have a condition that prevents you from absorbing dietary iron, such as: ? Infection  in your intestines. ? Celiac disease. This involves long-lasting (chronic) inflammation of your intestines.  You do not eat enough iron.  You eat a diet that is high in foods that impair iron absorption.  You have lost a lot of blood.  You have heavy bleeding during your menstrual cycle.  You are pregnant.  What is my plan? Your health care provider may help  you to determine how much iron you need per day based on your condition. Generally, when a person consumes sufficient amounts of iron in the diet, the following iron needs are met:  Men. ? 73-60 years old: 11 mg per day. ? 42-53 years old: 8 mg per day.  Women. ? 61-14 years old: 15 mg per day. ? 8-89 years old: 18 mg per day. ? Over 7 years old: 8 mg per day. ? Pregnant women: 27 mg per day. ? Breastfeeding women: 9 mg per day.  What do I need to know about an iron-rich diet?  Eat fresh fruits and vegetables that are high in vitamin C along with foods that are high in iron. This will help increase the amount of iron that your body absorbs from food, especially with foods containing nonheme iron. Foods that are high in vitamin C include oranges, peppers, tomatoes, and mango.  Take iron supplements only as directed by your health care provider. Overdose of iron can be life-threatening. If you were prescribed iron supplements, take them with orange juice or a vitamin C supplement.  Cook foods in pots and pans that are made from iron.  Eat nonheme iron-containing foods alongside foods that are high in heme iron. This helps to improve your iron absorption.  Certain foods and drinks contain compounds that impair iron absorption. Avoid eating these foods in the same meal as iron-rich foods or with iron supplements. These include: ? Coffee, black tea, and red wine. ? Milk, dairy products, and foods that are high in calcium. ? Beans, soybeans, and peas. ? Whole grains.  When eating foods that contain both nonheme iron and  compounds that impair iron absorption, follow these tips to absorb iron better. ? Soak beans overnight before cooking. ? Soak whole grains overnight and drain them before using. ? Ferment flours before baking, such as using yeast in bread dough. What foods can I eat? Grains Iron-fortified breakfast cereal. Iron-fortified whole-wheat bread. Enriched rice. Sprouted grains. Vegetables Spinach. Potatoes with skin. Green peas. Broccoli. Red and green bell peppers. Fermented vegetables. Fruits Prunes. Raisins. Oranges. Strawberries. Mango. Grapefruit. Meats and Other Protein Sources Beef liver. Oysters. Beef. Shrimp. [place]. Chicken. Vineyard. Sardines. Chickpeas. Nuts. Tofu. Beverages Tomato juice. Fresh orange juice. Prune juice. Hibiscus tea. Fortified instant breakfast shakes. Condiments Tahini. Fermented soy sauce. Sweets and Desserts Black-strap molasses. Other Wheat germ. The items listed above may not be a complete list of recommended foods or beverages. Contact your dietitian for more options. What foods are not recommended? Grains Whole grains. Bran cereal. Bran flour. Oats. Vegetables Artichokes. Brussels sprouts. Kale. Fruits Blueberries. Raspberries. Strawberries. Figs. Meats and Other Protein Sources Soybeans. Products made from soy protein. Dairy Milk. Cream. Cheese. Yogurt. Cottage cheese. Beverages Coffee. Black tea. Red wine. Sweets and Desserts Cocoa. Chocolate. Ice cream. Other Basil. Oregano. Parsley. The items listed above may not be a complete list of foods and beverages to avoid. Contact your dietitian for more information. This information is not intended to replace advice given to you by your health care provider. Make sure you discuss any questions you have with your health care provider. Document Released: 06/13/2005 Document Revised: 05/19/2016 Document Reviewed: 05/27/2014 Elsevier Interactive Patient Education  2018 [place]  PEDIATRIC/FAMILY PRACTICE PHYSICIANS  [facility] [address] [place], [place] [address] Phone - [phone]   Fax - [place] [address] [place], [place]  [address] Phone - [phone]   Fax -  [place] [address], [place] [address] [place], [place]  [address] Phone - [phone]   Fax - [phone]  [facility] [address] [place], [place]  [address] Phone - [phone]   Fax - [phone]  [place] [address], [place], [place]  [address] Phone - [phone]   Fax - [place] [address] [place], [place]  [address] Phone - [phone]   Fax - [place] [facility] [place] [address] [place], [place]  [address] Phone - [phone]   Fax - [phone]  [place] [address] [place], [place], [place]  [address] Phone - [phone]   Fax - [place] [address] [place], [place]  [address] Phone - [phone]   Fax - [phone] [facility] [place]. [address] [place], [place]  [address] Phone - [phone]   Fax - [phone]  [place] [address] [place], [place]  [address] Phone - [phone]   Fax - [phone]  [facility] AT [place], [place], [place]  [address] Phone - [phone]   Fax - [place] [address] [place], [place]  [address] Phone - [phone]   Fax - [phone]  [facility] [address] [place], [place]  [address] Phone - [place], [place]  [address] Phone - [phone]   Fax - [place] [address]  [place], [place]  [address] Phone - [phone]   Fax - [phone]  [place] [address] [place], [place]  [address] Phone - [phone]   Fax - [place]. [place], [place]  [address] Phone - [phone]   Fax - [place], [place], [place]  [address] Phone - [phone]   Fax - [place] [address] [place], [place]  [address] Phone - [phone]   Fax - [phone]  [place] [address], [place], [place]  [address] Phone - [phone]   Fax - [place] [address] [place], [place]  [address] Phone - [phone]   Fax - [phone]  [place] [address] [place], [place]  [address] Phone - [phone]   Fax - [phone] [place] [address] [place], [place]  [address] Phone - [phone]   Fax - [place] [address] [place], [place]  [address] Phone - [phone]   Fax - [place] [address] [place],   [address] Phone - [phone]   Fax - [phone]  Thinking About Doren Custard???  Why consider waterbirth? . Gentle birth for babies . Less pain medicine used in labor . May allow for passive descent/less pushing . May reduce perineal tears  . More mobility and instinctive maternal position changes . Increased maternal relaxation . Reduced blood pressure in labor  Is waterbirth safe? What are the risks of infection, drowning or other complications? . Infection o Very low risk (3.7 % for tub vs 4.8% for bed)  o 7 in 78 waterbirths with documented infection o Poorly cleaned equipment most common cause o Slightly lower group B strep transmission rate  . Drowning o Maternal:   - Very low risk   - Related to seizures or fainting o Newborn:  - Very low risk. No evidence of increased risk of respiratory problems in multiple large studies - Physiological protection from breathing under water - Avoid underwater birth if there are any fetal complications - Once baby's head is out of the water, keep it out.  . Birth complication o Some reports of cord trauma, but risk decreased by bringing baby to surface gradually o No evidence of increased risk of shoulder dystocia. Mothers can usually change positions faster in water than in a bed, possibly aiding the maneuvers to free the shoulder.  You must attend a Doren Custard class at [facility]  3rd Wednesday of every month from 7-9pm  Free  Register by calling [phone] or online at [email or web address]  Bring [place] the certificate from the class  Waterbirth supplies needed for [facility] patients:  Our practice has a Heritage manager in a Box tub (Regular size) at the hospital that you can borrow  You will need to purchase an accessory kit that has all needed supplies through [facility] [phone] for kit, $65 for liner=$179+tax) or online through GotWeb[employer].is  Or you can purchase the supplies separately: o Single-use disposable tub liner for [employer] in a Box (REGULAR size) o New garden hose labeled "lead-free", "suitable for drinking water", "non-toxic" OR "water potable" o Garden hose to remove the dirty water o Electric drain pump to remove water (We recommend 792 gallon per hour or greater pump.)  o Fish net o Bathing suit top (optional) o Long-handled mirror (optional)  [employer]- sells EVERYTHING waterbirth related, accessory kits, tubs, etc  The [employer] (www.thelaborladies.com) this is a great service if you don't want to be responsible for the set up/take down of tub! Just call the Labor Ladies and they will come do it for you for $200! This includes the rental fee for  their tub, the accessory kit, set-up and take down   Things that would prevent you from having a waterbirth:  Premature, <37wks  Previous cesarean birth  Presence of thick meconium-stained fluid  Multiple gestation (Twins, triplets, etc.)  Uncontrolled diabetes  Hypertension  Heavy vaginal bleeding  Non-reassuring fetal heart rate  Active infection (MRSA, etc.)  If your labor has to be induced  Other risk issues identified by your obstetrical provider

## 2017-11-12 NOTE — Progress Notes (Signed)
   LOW-RISK PREGNANCY VISIT Patient name: Kayla Franklin MRN [id number]  Date of birth: [birth year] Chief Complaint:   Routine Prenatal Visit  History of Present Illness:   Kayla Franklin is a 26 y.o. G75P0020 female at [redacted]w[redacted]d with an Estimated Date of Delivery: 01/10/18 being seen today for ongoing management of a low-risk pregnancy.  Today she reports no complaints. Planning waterbirth, went to class 12/13, plans to use our tub. Contractions: Not present. Vag. Bleeding: None.  Movement: Present. denies leaking of fluid. Review of Systems:   Pertinent items are noted in HPI Denies abnormal vaginal discharge w/ itching/odor/irritation, headaches, visual changes, shortness of breath, chest pain, abdominal pain, severe nausea/vomiting, or problems with urination or bowel movements unless otherwise stated above. Pertinent History Reviewed:  Reviewed past medical,surgical, social, obstetrical and family history.  Reviewed problem list, medications and allergies. Physical Assessment:   Vitals:   11/12/17 0900  BP: 112/68  Pulse: 86  Weight: 182 lb (82.6 kg)  Body mass index is 32.24 kg/m.        Physical Examination:   General appearance: Well appearing, and in no distress  Mental status: Alert, oriented to person, place, and time  Skin: Warm & dry  Cardiovascular: Normal heart rate noted  Respiratory: Normal respiratory effort, no distress  Abdomen: Soft, gravid, nontender  Pelvic: Cervical exam deferred         Extremities: Edema: None  Fetal Status: Fetal Heart Rate (bpm): 145 Fundal Height: 32 cm Movement: Present    Results for orders placed or performed in visit on 11/12/17 (from the past 24 hour(s))  POCT urinalysis dipstick   Collection Time: 11/12/17  9:03 AM  Result Value Ref Range   Color, UA     Clarity, UA     Glucose, UA neg    Bilirubin, UA     Ketones, UA neg    Spec Grav, UA  1.010 - 1.025   Blood, UA neg    pH, UA  5.0 - 8.0   Protein, UA neg    Urobilinogen, UA  0.2 or 1.0 E.U./dL   Nitrite, UA neg    Leukocytes, UA Negative Negative   Appearance     Odor      Assessment & Plan:  1) Low-risk pregnancy G3P0020 at [redacted]w[redacted]d with an Estimated Date of Delivery: 01/10/18   2) Plans waterbirth, already taken class, will get consent ~36wks  3) Multiple uterine fibroids> growth u/s @ 32wks per JVF  4) Anemia> Hgb 9.2 on pn2, hasn't started fe, to take bid, increase fe-rich foods  5) Rh neg> Rhogam today   Labs/procedures today: Rhogam today  Plan:  Continue routine obstetrical care   Reviewed: Preterm labor symptoms and general obstetric precautions including but not limited to vaginal bleeding, contractions, leaking of fluid and fetal movement were reviewed in detail with the patient.  Recommended Tdap at HD/PCP per CDC guidelines. All questions were answered  Follow-up: Return in about 1 week (around 11/19/2017) for LROB, US:EFW.  Orders Placed This Encounter  Procedures  . US OB Follow Up  . RHO (D) Immune Globulin  . POCT urinalysis dipstick   Tawnya Crook CNM, [facility] 11/12/2017 9:26 AM

## 2017-11-13 NOTE — L&D Delivery Note (Addendum)
Delivery Note Pushed well during second stage.  Vertex moved to OA position.  FHR remained stable throughout.  Some BPs were elevated so Labetalol given.   At 4:37 AM a viable and healthy female was delivered via Vaginal, Spontaneous (Presentation:OA ).  APGAR: 7, 9; weight  .   Placenta status: spontaneous and grossly intact with 3 vessel cord  with the following complications:  none  Anesthesia:  epidural Episiotomy: None Lacerations: 1st degree;Perineal, and periclitoral Suture Repair: 3-0 Monocryl Est. Blood Loss (mL):  600  Mom to postpartum.  Baby to Couplet care / Skin to Skin.  Hansel Feinstein 12/30/2017, 5:08 AM  Please schedule this patient for Postpartum visit in: 1 week with the following provider: Any provider For C/S patients schedule nurse incision check in weeks 2 weeks: no High risk pregnancy complicated by: HTN Delivery mode:  SVD Anticipated Birth Control:  Condoms PP Procedures needed: BP check  Schedule Integrated [place] visit: no

## 2017-11-20 ENCOUNTER — Other Ambulatory Visit: Payer: Medicaid Other

## 2017-11-20 ENCOUNTER — Encounter: Payer: Self-pay | Admitting: Obstetrics & Gynecology

## 2017-11-20 ENCOUNTER — Ambulatory Visit (INDEPENDENT_AMBULATORY_CARE_PROVIDER_SITE_OTHER): Payer: Medicaid Other | Admitting: Obstetrics & Gynecology

## 2017-11-20 ENCOUNTER — Ambulatory Visit (INDEPENDENT_AMBULATORY_CARE_PROVIDER_SITE_OTHER): Payer: Medicaid Other

## 2017-11-20 VITALS — BP 122/70 | HR 98 | Wt 184.0 lb

## 2017-11-20 DIAGNOSIS — O10913 Unspecified pre-existing hypertension complicating pregnancy, third trimester: Secondary | ICD-10-CM | POA: Diagnosis not present

## 2017-11-20 DIAGNOSIS — D259 Leiomyoma of uterus, unspecified: Secondary | ICD-10-CM

## 2017-11-20 DIAGNOSIS — Z3A32 32 weeks gestation of pregnancy: Secondary | ICD-10-CM | POA: Diagnosis not present

## 2017-11-20 DIAGNOSIS — Z3483 Encounter for supervision of other normal pregnancy, third trimester: Secondary | ICD-10-CM

## 2017-11-20 DIAGNOSIS — Z1389 Encounter for screening for other disorder: Secondary | ICD-10-CM

## 2017-11-20 DIAGNOSIS — Z331 Pregnant state, incidental: Secondary | ICD-10-CM

## 2017-11-20 DIAGNOSIS — O3413 Maternal care for benign tumor of corpus uteri, third trimester: Secondary | ICD-10-CM | POA: Diagnosis not present

## 2017-11-20 LAB — POCT URINALYSIS DIPSTICK
Glucose, UA: NEGATIVE
KETONES UA: NEGATIVE
Leukocytes, UA: NEGATIVE
Nitrite, UA: NEGATIVE
Protein, UA: NEGATIVE
RBC UA: NEGATIVE

## 2017-11-20 NOTE — Progress Notes (Signed)
[place] 76+5 wks,cephalic,posterior pl gr 2,normal right ovary,left ovary not visualized,anterior fibroid 7.3 x 3.9 x 5.8 cm N/C,posterior fibroid not visualized,afi 13 cm,fhr 146 bpm,efw 2147 g 54%

## 2017-11-20 NOTE — Progress Notes (Signed)
[id number] [redacted]w[redacted]d Estimated Date of Delivery: 01/10/18  Blood pressure 122/70, pulse 98, weight 184 lb (83.5 kg), last menstrual period 04/05/2017.   BP weight and urine results all reviewed and noted.  Please refer to the obstetrical flow sheet for the fundal height and fetal heart rate documentation:  Patient reports good fetal movement, denies any bleeding and no rupture of membranes symptoms or regular contractions. Patient is without complaints. All questions were answered.  Orders Placed This Encounter  Procedures  . POCT urinalysis dipstick    Plan:  Continued routine obstetrical care, fibroid is stable no grwoth Fetal growth is good 54%  Return in about 2 weeks (around 12/04/2017) for LROB.

## 2017-12-04 ENCOUNTER — Ambulatory Visit (INDEPENDENT_AMBULATORY_CARE_PROVIDER_SITE_OTHER): Payer: Medicaid Other | Admitting: Obstetrics & Gynecology

## 2017-12-04 ENCOUNTER — Encounter: Payer: Self-pay | Admitting: Obstetrics & Gynecology

## 2017-12-04 VITALS — BP 120/80 | HR 78 | Wt 187.0 lb

## 2017-12-04 DIAGNOSIS — O9989 Other specified diseases and conditions complicating pregnancy, childbirth and the puerperium: Secondary | ICD-10-CM

## 2017-12-04 DIAGNOSIS — G5602 Carpal tunnel syndrome, left upper limb: Secondary | ICD-10-CM | POA: Diagnosis not present

## 2017-12-04 DIAGNOSIS — Z1389 Encounter for screening for other disorder: Secondary | ICD-10-CM

## 2017-12-04 DIAGNOSIS — G5601 Carpal tunnel syndrome, right upper limb: Secondary | ICD-10-CM | POA: Diagnosis not present

## 2017-12-04 DIAGNOSIS — Z331 Pregnant state, incidental: Secondary | ICD-10-CM

## 2017-12-04 DIAGNOSIS — Z3A34 34 weeks gestation of pregnancy: Secondary | ICD-10-CM

## 2017-12-04 DIAGNOSIS — Z3483 Encounter for supervision of other normal pregnancy, third trimester: Secondary | ICD-10-CM

## 2017-12-04 DIAGNOSIS — G5600 Carpal tunnel syndrome, unspecified upper limb: Secondary | ICD-10-CM

## 2017-12-04 LAB — POCT URINALYSIS DIPSTICK
Glucose, UA: NEGATIVE
LEUKOCYTES UA: NEGATIVE
Nitrite, UA: NEGATIVE
RBC UA: NEGATIVE

## 2017-12-04 MED ORDER — CARPAL TUNNEL WRIST STABILIZER MISC: 2.0000 | 0 refills | Status: DC

## 2017-12-04 NOTE — Progress Notes (Signed)
[id number] [redacted]w[redacted]d Estimated Date of Delivery: 01/10/18  Blood pressure 120/80, pulse 78, weight 187 lb (84.8 kg), last menstrual period 04/05/2017.   BP weight and urine results all reviewed and noted.  Please refer to the obstetrical flow sheet for the fundal height and fetal heart rate documentation:  Patient reports good fetal movement, denies any bleeding and no rupture of membranes symptoms or regular contractions. Patient is without complaints. All questions were answered.  Orders Placed This Encounter  Procedures  . POCT urinalysis dipstick    Plan:  Continued routine obstetrical care, carpal tunnel braces for carpal tunnel symptoms  Return in about 2 weeks (around 12/18/2017) for LROB.

## 2017-12-18 ENCOUNTER — Ambulatory Visit (INDEPENDENT_AMBULATORY_CARE_PROVIDER_SITE_OTHER): Payer: Medicaid Other | Admitting: Obstetrics & Gynecology

## 2017-12-18 ENCOUNTER — Encounter: Payer: Self-pay | Admitting: Obstetrics & Gynecology

## 2017-12-18 VITALS — BP 128/80 | HR 89 | Wt 196.0 lb

## 2017-12-18 DIAGNOSIS — Z1389 Encounter for screening for other disorder: Secondary | ICD-10-CM

## 2017-12-18 DIAGNOSIS — Z3483 Encounter for supervision of other normal pregnancy, third trimester: Secondary | ICD-10-CM

## 2017-12-18 DIAGNOSIS — Z3A36 36 weeks gestation of pregnancy: Secondary | ICD-10-CM

## 2017-12-18 DIAGNOSIS — Z331 Pregnant state, incidental: Secondary | ICD-10-CM

## 2017-12-18 LAB — POCT URINALYSIS DIPSTICK
Blood, UA: NEGATIVE
Glucose, UA: NEGATIVE
Ketones, UA: NEGATIVE
Leukocytes, UA: NEGATIVE
Nitrite, UA: NEGATIVE
Protein, UA: NEGATIVE

## 2017-12-18 LAB — OB RESULTS CONSOLE GBS: GBS: POSITIVE

## 2017-12-18 NOTE — Progress Notes (Signed)
[id number] [redacted]w[redacted]d Estimated Date of Delivery: 01/10/18  Blood pressure 128/80, pulse 89, weight 196 lb (88.9 kg), last menstrual period 04/05/2017.   BP weight and urine results all reviewed and noted.  Please refer to the obstetrical flow sheet for the fundal height and fetal heart rate documentation:  Patient reports good fetal movement, denies any bleeding and no rupture of membranes symptoms or regular contractions. Patient is without complaint wrists better, swelling is terrible All questions were answered.  Orders Placed This Encounter  Procedures  . Culture, beta strep (group b only)  . GC/Chlamydia Probe Amp([facility])  . POCT Urinalysis Dipstick    Plan:  Continued routine obstetrical care, cultures done, CX LTC  Return in about 1 week (around 12/25/2017) for LROB.

## 2017-12-20 LAB — GC/CHLAMYDIA PROBE AMP
Chlamydia trachomatis, NAA: NEGATIVE
Neisseria gonorrhoeae by PCR: NEGATIVE

## 2017-12-21 LAB — CULTURE, BETA STREP (GROUP B ONLY): STREP GP B CULTURE: POSITIVE — AB

## 2017-12-27 ENCOUNTER — Encounter: Payer: Self-pay | Admitting: Obstetrics & Gynecology

## 2017-12-27 ENCOUNTER — Ambulatory Visit (INDEPENDENT_AMBULATORY_CARE_PROVIDER_SITE_OTHER): Payer: Medicaid Other | Admitting: Obstetrics & Gynecology

## 2017-12-27 ENCOUNTER — Inpatient Hospital Stay (HOSPITAL_COMMUNITY)
Admission: AD | Admit: 2017-12-27 | Discharge: 2018-01-01 | DRG: 807 | Disposition: A | Discharge status: Home / Self Care | Payer: Medicaid Other | Source: Ambulatory Visit | Attending: Obstetrics & Gynecology | Admitting: Obstetrics & Gynecology

## 2017-12-27 ENCOUNTER — Encounter (HOSPITAL_COMMUNITY): Payer: Self-pay

## 2017-12-27 ENCOUNTER — Other Ambulatory Visit: Payer: Self-pay

## 2017-12-27 VITALS — BP 150/100 | HR 89 | Wt 199.0 lb

## 2017-12-27 DIAGNOSIS — O133 Gestational [pregnancy-induced] hypertension without significant proteinuria, third trimester: Secondary | ICD-10-CM

## 2017-12-27 DIAGNOSIS — Z3A38 38 weeks gestation of pregnancy: Secondary | ICD-10-CM

## 2017-12-27 DIAGNOSIS — Z87891 Personal history of nicotine dependence: Secondary | ICD-10-CM

## 2017-12-27 DIAGNOSIS — O1414 Severe pre-eclampsia complicating childbirth: Secondary | ICD-10-CM | POA: Diagnosis present

## 2017-12-27 DIAGNOSIS — Z3483 Encounter for supervision of other normal pregnancy, third trimester: Secondary | ICD-10-CM

## 2017-12-27 DIAGNOSIS — O134 Gestational [pregnancy-induced] hypertension without significant proteinuria, complicating childbirth: Secondary | ICD-10-CM | POA: Diagnosis present

## 2017-12-27 DIAGNOSIS — O99824 Streptococcus B carrier state complicating childbirth: Secondary | ICD-10-CM | POA: Diagnosis present

## 2017-12-27 DIAGNOSIS — O9081 Anemia of the puerperium: Secondary | ICD-10-CM | POA: Diagnosis not present

## 2017-12-27 DIAGNOSIS — Z7982 Long term (current) use of aspirin: Secondary | ICD-10-CM | POA: Diagnosis not present

## 2017-12-27 DIAGNOSIS — O864 Pyrexia of unknown origin following delivery: Secondary | ICD-10-CM | POA: Diagnosis not present

## 2017-12-27 DIAGNOSIS — Z1389 Encounter for screening for other disorder: Secondary | ICD-10-CM

## 2017-12-27 DIAGNOSIS — Z331 Pregnant state, incidental: Secondary | ICD-10-CM

## 2017-12-27 LAB — URINALYSIS, ROUTINE W REFLEX MICROSCOPIC
BILIRUBIN URINE: NEGATIVE
Glucose, UA: NEGATIVE mg/dL
HGB URINE DIPSTICK: NEGATIVE
Ketones, ur: NEGATIVE mg/dL
LEUKOCYTES UA: NEGATIVE
Nitrite: NEGATIVE
Protein, ur: 30 mg/dL — AB
SPECIFIC GRAVITY, URINE: 1.027 (ref 1.005–1.030)
pH: 5 (ref 5.0–8.0)

## 2017-12-27 LAB — PROTEIN / CREATININE RATIO, URINE
CREATININE, URINE: 271 mg/dL
Protein Creatinine Ratio: 0.11 mg/mg{Cre} (ref 0.00–0.15)
Total Protein, Urine: 30 mg/dL

## 2017-12-27 LAB — COMPREHENSIVE METABOLIC PANEL
ALBUMIN: 2.9 g/dL — AB (ref 3.5–5.0)
ALK PHOS: 222 U/L — AB (ref 38–126)
ALT: 14 U/L (ref 14–54)
AST: 31 U/L (ref 15–41)
Anion gap: 9 (ref 5–15)
BILIRUBIN TOTAL: 0.7 mg/dL (ref 0.3–1.2)
BUN: 8 mg/dL (ref 6–20)
CALCIUM: 8.6 mg/dL — AB (ref 8.9–10.3)
CO2: 21 mmol/L — ABNORMAL LOW (ref 22–32)
CREATININE: 0.6 mg/dL (ref 0.44–1.00)
Chloride: 109 mmol/L (ref 101–111)
GFR calc Af Amer: >60 mL/min (ref >60)
GLUCOSE: 109 mg/dL — AB (ref 65–99)
Potassium: 3.9 mmol/L (ref 3.5–5.1)
Sodium: 139 mmol/L (ref 135–145)
TOTAL PROTEIN: 6.3 g/dL — AB (ref 6.5–8.1)

## 2017-12-27 LAB — CBC
HEMATOCRIT: 28.8 % — AB (ref 36.0–46.0)
HEMOGLOBIN: 9 g/dL — AB (ref 12.0–15.0)
MCH: 22.5 pg — ABNORMAL LOW (ref 26.0–34.0)
MCHC: 31.3 g/dL (ref 30.0–36.0)
MCV: 72 fL — ABNORMAL LOW (ref 78.0–100.0)
Platelets: 207 10*3/uL (ref 150–400)
RBC: 4 MIL/uL (ref 3.87–5.11)
RDW: 17.3 % — AB (ref 11.5–15.5)
WBC: 7.5 10*3/uL (ref 4.0–10.5)

## 2017-12-27 LAB — POCT URINALYSIS DIPSTICK
Glucose, UA: NEGATIVE
LEUKOCYTES UA: NEGATIVE
Nitrite, UA: NEGATIVE
RBC UA: NEGATIVE

## 2017-12-27 MED ORDER — OXYCODONE-ACETAMINOPHEN 5-325 MG PO TABS: 2.0000 | ORAL_TABLET | ORAL | Status: DC | PRN

## 2017-12-27 MED ORDER — LACTATED RINGERS IV SOLN: 500.0000 mL | INTRAVENOUS | Status: DC | PRN

## 2017-12-27 MED ORDER — LIDOCAINE HCL (PF) 1 % IJ SOLN
30.0000 mL | INTRAMUSCULAR | Status: DC | PRN
  Filled 2017-12-27: qty 30

## 2017-12-27 MED ORDER — ONDANSETRON HCL 4 MG/2ML IJ SOLN: 4.0000 mg | Freq: Four times a day (QID) | INTRAMUSCULAR | Status: DC | PRN

## 2017-12-27 MED ORDER — ZOLPIDEM TARTRATE 5 MG PO TABS
5.0000 mg | ORAL_TABLET | Freq: Every evening | ORAL | Status: DC | PRN
Start: 2017-12-27 — End: 2017-12-30

## 2017-12-27 MED ORDER — ACETAMINOPHEN 325 MG PO TABS: 650.0000 mg | ORAL_TABLET | ORAL | Status: DC | PRN

## 2017-12-27 MED ORDER — OXYTOCIN 40 UNITS IN LACTATED RINGERS INFUSION - SIMPLE MED
2.5000 [IU]/h | INTRAVENOUS | Status: DC
  Administered 2017-12-30: 2.5 [IU]/h via INTRAVENOUS

## 2017-12-27 MED ORDER — SOD CITRATE-CITRIC ACID 500-334 MG/5ML PO SOLN
30.0000 mL | ORAL | Status: DC | PRN
Start: 2017-12-27 — End: 2017-12-30

## 2017-12-27 MED ORDER — OXYCODONE-ACETAMINOPHEN 5-325 MG PO TABS: 1.0000 | ORAL_TABLET | ORAL | Status: DC | PRN

## 2017-12-27 MED ORDER — SODIUM CHLORIDE 0.9 % IV SOLN
5.0000 10*6.[IU] | Freq: Once | INTRAVENOUS | Status: AC
  Administered 2017-12-28: 5 10*6.[IU] via INTRAVENOUS
  Filled 2017-12-27: qty 5

## 2017-12-27 MED ORDER — FLEET ENEMA 7-19 GM/118ML RE ENEM: 1.0000 | ENEMA | Freq: Every day | RECTAL | Status: DC | PRN

## 2017-12-27 MED ORDER — OXYTOCIN BOLUS FROM INFUSION
500.0000 mL | Freq: Once | INTRAVENOUS | Status: AC
  Administered 2017-12-30: 500 mL via INTRAVENOUS

## 2017-12-27 MED ORDER — TERBUTALINE SULFATE 1 MG/ML IJ SOLN: 0.2500 mg | Freq: Once | INTRAMUSCULAR | Status: DC | PRN

## 2017-12-27 MED ORDER — MISOPROSTOL 25 MCG QUARTER TABLET
25.0000 ug | ORAL_TABLET | ORAL | Status: DC | PRN
  Administered 2017-12-27 – 2017-12-28 (×3): 25 ug via VAGINAL
  Filled 2017-12-27 (×4): qty 1

## 2017-12-27 MED ORDER — OXYTOCIN 40 UNITS IN LACTATED RINGERS INFUSION - SIMPLE MED
1.0000 m[IU]/min | INTRAVENOUS | Status: DC
  Administered 2017-12-28 – 2017-12-29 (×2): 2 m[IU]/min via INTRAVENOUS
  Administered 2017-12-29: 14 m[IU]/min via INTRAVENOUS
  Filled 2017-12-27 (×2): qty 1000

## 2017-12-27 MED ORDER — LACTATED RINGERS IV SOLN
INTRAVENOUS | Status: DC
  Administered 2017-12-27 – 2017-12-29 (×7): via INTRAVENOUS

## 2017-12-27 MED ORDER — PENICILLIN G POT IN DEXTROSE 60000 UNIT/ML IV SOLN
3.0000 10*6.[IU] | INTRAVENOUS | Status: DC
  Administered 2017-12-28 – 2017-12-30 (×12): 3 10*6.[IU] via INTRAVENOUS
  Filled 2017-12-27 (×16): qty 50

## 2017-12-27 NOTE — Progress Notes (Signed)
[id number] [redacted]w[redacted]d Estimated Date of Delivery: 01/10/18  Blood pressure (!) 150/100, pulse 89, weight 199 lb (90.3 kg), last menstrual period 04/05/2017.   BP weight and urine results all reviewed and noted.  Please refer to the obstetrical flow sheet for the fundal height and fetal heart rate documentation:  Patient reports good fetal movement, denies any bleeding and no rupture of membranes symptoms or regular contractions. Patient is without complaints. All questions were answered.  Orders Placed This Encounter  Procedures  . POCT urinalysis dipstick    Plan:  Continued routine obstetrical care, elevated BP x 2, 152/98, 150/100 with 2 different cuffs, protein is insignificant, so probably gestational hypertension, discussed with Dr Roselie Awkward who agrees with proceeding with delivery, EFW at 35 weeks 52%  Return in about 8 days (around 01/04/2018) for BP check.

## 2017-12-27 NOTE — H&P (Signed)
Kayla Franklin is a 27 y.o. female G3P0020 with IUP at [redacted]w[redacted]d presenting for IOL for GHTN, dx today. [facility] at [facility] 13 wks  Prenatal History/Complications:  GHTN   Past Medical History: Past Medical History:  Diagnosis Date  . Heart murmur   . Heart murmur   . Hypertension 03/19/2013  . Substance abuse ([place])   . Trichimoniasis   . UTI (lower urinary tract infection)     Past Surgical History: Past Surgical History:  Procedure Laterality Date  . CYST EXCISION     nasal cyst    Obstetrical History: OB History    Gravida Para Term Preterm AB Living   3       2     SAB TAB Ectopic Multiple Live Births     1            Social History: Social History   Socioeconomic History  . Marital status: Single    Spouse name: None  . Number of children: None  . Years of education: None  . Highest education level: None  Social Needs  . Financial resource strain: None  . Food insecurity - worry: None  . Food insecurity - inability: None  . Transportation needs - medical: None  . Transportation needs - non-medical: None  Occupational History  . None  Tobacco Use  . Smoking status: Former Smoker    Packs/day: 0.10    Years: 6.00    Pack years: 0.60    Types: Cigarettes  . Smokeless tobacco: Never Used  Substance and Sexual Activity  . Alcohol use: No    Frequency: Never    Comment: occassional before pregnancy  . Drug use: No    Comment: last used 2 years ago  . Sexual activity: Yes    Birth control/protection: None  Other Topics Concern  . None  Social History Narrative  . None    Family History: Family History  Problem Relation Age of Onset  . Heart disease Father        CHF  . Hypertension Father   . Hypertension Sister   . Hypertension Mother   . Hypertension Paternal Grandmother   . Diabetes Other        great grandmother    Allergies: No Known Allergies  Medications Prior to Admission  Medication Sig Dispense Refill Last Dose  .  aspirin EC 81 MG tablet Take 1 tablet (81 mg total) by mouth daily. 30 tablet 6 12/26/2017 at Unknown time  . ferrous sulfate 325 (65 FE) MG tablet Take 325 mg by mouth daily with breakfast.   12/26/2017 at Unknown time  . Prenatal Vit-Fe Fumarate-FA (PNV PRENATAL PLUS MULTIVITAMIN) 27-1 MG TABS Take 1 tablet by mouth daily. 30 tablet 11 12/26/2017 at Unknown time  . Elastic Bandages & Supports (CARPAL TUNNEL WRIST STABILIZER) MISC 2 Devices by Does not apply route as directed. 2 each 0 Taking        Review of Systems   Constitutional: Negative for fever and chills Eyes: Negative for visual disturbances Respiratory: Negative for shortness of breath, dyspnea Cardiovascular: Negative for chest pain or palpitations  Gastrointestinal: Negative for abdominal pain, vomiting, diarrhea and constipation.   Genitourinary: Negative for dysuria and urgency Musculoskeletal: Negative for back pain, joint pain, myalgias  Neurological: Negative for dizziness and headaches      Temperature 97.8 F (36.6 C), temperature source Oral, resp. rate 17, height 5\' 3"  (1.6 m), weight 90.2 kg (198 lb 14.4 oz), last  menstrual period 04/05/2017. General appearance: alert, cooperative and no distress Lungs: clear to auscultation bilaterally Heart: regular rate and rhythm Abdomen: soft, non-tender; bowel sounds normal Extremities: Homans sign is negative, no sign of DVT DTR's 2+ Presentation: cephalic Fetal monitoring  Baseline: 140 bpm, Variability: Good {> 6 bpm), Accelerations: Reactive and Decelerations: Absent Uterine activity  None      Prenatal labs: ABO, Rh: A/Negative/-- (08/23 1132) Antibody: Negative (12/07 0854) Rubella: immune RPR: Non Reactive (12/07 0854)  HBsAg: Negative (08/23 1132)  HIV: Non Reactive (12/07 0854)  GBS: Positive (02/05 0000)    Prenatal Transfer Tool  Maternal Diabetes: No Genetic Screening: Normal Maternal Ultrasounds/Referrals: Normal Fetal Ultrasounds or other  Referrals:  None Maternal Substance Abuse:  No Significant Maternal Medications:  None Significant Maternal Lab Results: Lab values include: Group B Strep positive    Results for orders placed or performed in visit on 12/27/17 (from the past 24 hour(s))  POCT urinalysis dipstick   Collection Time: 12/27/17  1:43 PM  Result Value Ref Range   Color, UA     Clarity, UA     Glucose, UA neg    Bilirubin, UA     Ketones, UA small    Spec Grav, UA  1.010 - 1.025   Blood, UA neg    pH, UA  5.0 - 8.0   Protein, UA trace    Urobilinogen, UA  0.2 or 1.0 E.U./dL   Nitrite, UA neg    Leukocytes, UA Negative Negative   Appearance     Odor      [facility]  Initiated Care at  28 wk  FOB [facility]  Dating By LMP c/w U/S 10wk  Pap July 2018 at [facility] normal per pt  GC/CT Initial:     -/-           36+wks:  Genetic Screen NT/IT: neg  CF screen neg  Anatomic [place] Normal '[place]'  Flu vaccine declined  Tdap Recommended ~ 28wks  Glucose Screen  2 hr  74/149/128  GBS positive  Feed Preference breast  Contraception condoms  Circumcision n/a  Childbirth Classes Info given  Pediatrician [place] list given   Assessment: Kayla Franklin is a 27 y.o. G3P0020 with an IUP at [redacted]w[redacted]d presenting for IOL for GHTN.  Plan: #Labor: Cytotec->Foley->pitocin #Pain:  Per request #FWB Cat 1    Christin Fudge 12/27/2017, 8:57 PM

## 2017-12-28 ENCOUNTER — Encounter (HOSPITAL_COMMUNITY): Payer: Self-pay | Admitting: Anesthesiology

## 2017-12-28 LAB — RPR: RPR: NONREACTIVE

## 2017-12-28 MED ORDER — PHENYLEPHRINE 40 MCG/ML (10ML) SYRINGE FOR IV PUSH (FOR BLOOD PRESSURE SUPPORT)
80.0000 ug | PREFILLED_SYRINGE | INTRAVENOUS | Status: DC | PRN
  Filled 2017-12-28: qty 10
  Filled 2017-12-28: qty 5

## 2017-12-28 MED ORDER — FENTANYL 2.5 MCG/ML BUPIVACAINE 1/10 % EPIDURAL INFUSION (WH - ANES)
13.0000 mL/h | INTRAMUSCULAR | Status: DC | PRN
  Administered 2017-12-29 – 2017-12-30 (×2): 13 mL/h via EPIDURAL
  Filled 2017-12-28 (×2): qty 100

## 2017-12-28 MED ORDER — FENTANYL CITRATE (PF) 100 MCG/2ML IJ SOLN
50.0000 ug | INTRAMUSCULAR | Status: DC | PRN
  Administered 2017-12-29: 100 ug via INTRAVENOUS
  Filled 2017-12-28 (×2): qty 2

## 2017-12-28 MED ORDER — DIPHENHYDRAMINE HCL 50 MG/ML IJ SOLN
12.5000 mg | INTRAMUSCULAR | Status: DC | PRN
  Administered 2017-12-29: 12.5 mg via INTRAVENOUS
  Filled 2017-12-28: qty 1

## 2017-12-28 MED ORDER — LACTATED RINGERS IV SOLN
500.0000 mL | Freq: Once | INTRAVENOUS | Status: AC
  Administered 2017-12-30: 500 mL via INTRAVENOUS

## 2017-12-28 MED ORDER — MISOPROSTOL 50MCG HALF TABLET
50.0000 ug | ORAL_TABLET | Freq: Once | ORAL | Status: AC
  Administered 2017-12-28: 50 ug via BUCCAL
  Filled 2017-12-28: qty 1

## 2017-12-28 NOTE — Progress Notes (Signed)
Per Darrell Jewel NP, Practice [facility]  will take baby as patient despite no prenatal visit (appointment on 01/01/2018) and no previous children. Tech called before assigning. Charge and AD Thomes Dinning Made aware.

## 2017-12-28 NOTE — Progress Notes (Signed)
Kayla Franklin is a 27 y.o. G3P0020 at [redacted]w[redacted]d by LMP admitted for induction of labor due to [place].  Subjective: Patient called out to nurses station stating FB has fallen out.   Objective: BP (!) 172/96 Comment: plan to recheck BP. cuff adjusted  Pulse 81   Temp 98.4 F (36.9 C) (Oral)   Resp 20   Ht 5\' 3"  (1.6 m)   Wt 90.2 kg (198 lb 14.4 oz)   LMP 04/05/2017 (Exact Date)   BMI 35.23 kg/m  No intake/output data recorded. No intake/output data recorded.  FHT:  FHR: 150 bpm, variability: moderate,  accelerations:  Present,  decelerations:  Absent UC:   irregular, every 1-3 minutes SVE:   Dilation: 4 Effacement (%): 60 Station: -2, -3 Exam by:: stone rnc  Labs: Lab Results  Component Value Date   WBC 7.5 12/27/2017   HGB 9.0 (L) 12/27/2017   HCT 28.8 (L) 12/27/2017   MCV 72.0 (L) 12/27/2017   PLT 207 12/27/2017    Assessment / Plan: IOL for gHTN  Labor: FB out. Patient would like to eat lunch and then will start Pitocin. Will titrate per protocol Preeclampsia:  no signs or symptoms of toxicity, labs stable and Pr:Cr of 0.11 Fetal Wellbeing:  Category I Pain Control:  per patient request I/D:  PCN Anticipated MOD:  NSVD  Caroline More, DO PGY-1 12/28/2017, 3:18 PM

## 2017-12-28 NOTE — Anesthesia Preprocedure Evaluation (Addendum)
Anesthesia Evaluation  Patient identified by MRN, date of birth, ID band Patient awake    Reviewed: Allergy & Precautions, Patient's Chart, lab work & pertinent test results  Airway Mallampati: III  TM Distance: >3 FB Neck ROM: Full    Dental no notable dental hx. (+) Teeth Intact   Pulmonary former smoker,    Pulmonary exam normal breath sounds clear to auscultation       Cardiovascular hypertension, Normal cardiovascular exam+ Valvular Problems/Murmurs  Rhythm:Regular Rate:Normal + Systolic murmurs    Neuro/Psych negative neurological ROS  negative psych ROS   GI/Hepatic (+)     substance abuse  ,   Endo/Other  Obesity  Renal/GU negative Renal ROS  negative genitourinary   Musculoskeletal negative musculoskeletal ROS (+)   Abdominal (+) + obese,   Peds  Hematology  (+) anemia ,   Anesthesia Other Findings   Reproductive/Obstetrics (+) Pregnancy                            Anesthesia Physical Anesthesia Plan  ASA: II  Anesthesia Plan: Epidural   Post-op Pain Management:    Induction:   PONV Risk Score and Plan:   Airway Management Planned: Natural Airway  Additional Equipment:   Intra-op Plan:   Post-operative Plan:   Informed Consent: I have reviewed the patients History and Physical, chart, labs and discussed the procedure including the risks, benefits and alternatives for the proposed anesthesia with the patient or authorized representative who has indicated his/her understanding and acceptance.     Plan Discussed with: Anesthesiologist  Anesthesia Plan Comments:         Anesthesia Quick Evaluation

## 2017-12-28 NOTE — Progress Notes (Signed)
Patient ID: Kayla Franklin, female   DOB: 1991/01/16, 27 y.o.   MRN: [id number]  S: Patient doing well, very nervous about FB but wants to try as this will help progress labor.   O:  Dilation: Closed Effacement (%): Thick Cervical Position: Posterior Station: -3 Presentation: Vertex Exam by:: Catheryn Bacon, RN  A/P: FB placed by Derrill Memo, CNM.  Buccal cytotec x1  Will monitor FB to watch for cervical dilation  Caroline More, DO, PGY-1 12/28/2017 10:48 AM

## 2017-12-28 NOTE — Anesthesia Pain Management Evaluation Note (Signed)
  CRNA Pain Management Visit Note  Patient: Kayla Franklin, 27 y.o., female  "Hello I am a member of the anesthesia team at [facility]. We have an anesthesia team available at all times to provide care throughout the hospital, including epidural management and anesthesia for C-section. I don't know your plan for the delivery whether it a natural birth, water birth, IV sedation, nitrous supplementation, doula or epidural, but we want to meet your pain goals."   1.Was your pain managed to your expectations on prior hospitalizations?   No prior hospitalizations  2.What is your expectation for pain management during this hospitalization?     Labor support without medications, Epidural, IV pain meds and Nitrous Oxide  3.How can we help you reach that goal? Pt was open to discussion about all mehtods of pain control. Questions answered.   Record the patient's initial score and the patient's pain goal.   Pain: 0  Pain Goal: 7 [facility] wants you to be able to say your pain was always managed very well.  Tallen Schnorr 12/28/2017

## 2017-12-28 NOTE — Progress Notes (Signed)
Vitals:   12/27/17 2032 12/27/17 2046  BP: (!) 142/89   Pulse: 99   Resp:  17  Temp:  97.8 F (36.6 C)   FHR 140s, avg LTV, + accels, no decels. Occ mild ctx.  Con tinue cytotec

## 2017-12-28 NOTE — Progress Notes (Signed)
Kayla Franklin is a 27 y.o. G3P0020 at [redacted]w[redacted]d by LMP admitted for induction of labor due to [place].  Subjective: Patient doing well. States she can feel her contractions slightly more now.   Objective: BP (!) 159/92   Pulse 84   Temp 98.4 F (36.9 C) (Oral)   Resp 20   Ht 5\' 3"  (1.6 m)   Wt 90.2 kg (198 lb 14.4 oz)   LMP 04/05/2017 (Exact Date)   BMI 35.23 kg/m  No intake/output data recorded. No intake/output data recorded.  FHT:  FHR: 150 bpm, variability: moderate,  accelerations:  Present,  decelerations:  Absent UC:   irregular, every 3-4 minutes SVE:   Dilation: 4 Effacement (%): 60 Station: -2, -3 Exam by:: stone rnc  Labs: Lab Results  Component Value Date   WBC 7.5 12/27/2017   HGB 9.0 (L) 12/27/2017   HCT 28.8 (L) 12/27/2017   MCV 72.0 (L) 12/27/2017   PLT 207 12/27/2017    Assessment / Plan: IOL for gHTN  Labor: Pitocin at 75miliunits/min, will continue to titrate up per protocol Preeclampsia:  no signs or symptoms of toxicity, labs stable and Pr:Cr of 0.11 Fetal Wellbeing:  Category I Pain Control:  per patient request I/D:  PCN Anticipated MOD:  NSVD  Caroline More, DO PGY-1 12/28/2017, 5:02 PM

## 2017-12-28 NOTE — Progress Notes (Signed)
Kayla Franklin is a 27 y.o. G3P0020 at [redacted]w[redacted]d by LMP admitted for induction of labor due to [place].  Subjective: Patient dong well. FOB at bedside. Patient with no questions.   Objective: BP (!) 154/91   Pulse 79   Temp 97.9 F (36.6 C) (Oral)   Resp 16   Ht 5\' 3"  (1.6 m)   Wt 90.2 kg (198 lb 14.4 oz)   LMP 04/05/2017 (Exact Date)   BMI 35.23 kg/m  No intake/output data recorded. No intake/output data recorded.  FHT:  FHR: 150 bpm, variability: moderate,  accelerations:  Present,  decelerations:  Absent UC:   irregular, every 1-4 minutes SVE:   Dilation: Closed Effacement (%): Thick Station: -3 Exam by:: Catheryn Bacon, RN  Labs: Lab Results  Component Value Date   WBC 7.5 12/27/2017   HGB 9.0 (L) 12/27/2017   HCT 28.8 (L) 12/27/2017   MCV 72.0 (L) 12/27/2017   PLT 207 12/27/2017    Assessment / Plan: IOL for gHTN  Labor: due for cervical change @10am . At that time will assess if FB placement is possible. Continue cytotec for now  Preeclampsia:  no signs or symptoms of toxicity, labs stable and Pr:Cr of 0.11 Fetal Wellbeing:  Category I Pain Control:  per patient request I/D:  PCN Anticipated MOD:  NSVD  Caroline More, DO PGY-1 12/28/2017, 9:45 AM

## 2017-12-28 NOTE — Progress Notes (Signed)
Kayla Franklin is a 27 y.o. G3P0020 at [redacted]w[redacted]d by LMP admitted for induction of labor due to [place].  Subjective: Patient dong well. No questions at this time.   Objective: BP (!) 151/88   Pulse 74   Temp 98 F (36.7 C) (Oral)   Resp 18   Ht 5\' 3"  (1.6 m)   Wt 90.2 kg (198 lb 14.4 oz)   LMP 04/05/2017 (Exact Date)   BMI 35.23 kg/m  No intake/output data recorded. No intake/output data recorded.  FHT:  FHR: 150 bpm, variability: moderate,  accelerations:  Present,  decelerations:  Absent UC:   irregular, every 1-3 minutes SVE:   Dilation: 1 Effacement (%): Thick Station: -3 Exam by:: k shaw cnm  Labs: Lab Results  Component Value Date   WBC 7.5 12/27/2017   HGB 9.0 (L) 12/27/2017   HCT 28.8 (L) 12/27/2017   MCV 72.0 (L) 12/27/2017   PLT 207 12/27/2017    Assessment / Plan: IOL for gHTN  Labor: FB in place, cytotec given with FB.  Preeclampsia:  no signs or symptoms of toxicity, labs stable and Pr:Cr of 0.11 Fetal Wellbeing:  Category I Pain Control:  per patient request I/D:  PCN Anticipated MOD:  NSVD  Caroline More, DO PGY-1 12/28/2017, 1:08 PM

## 2017-12-28 NOTE — Progress Notes (Signed)
Vitals:   12/28/17 0202 12/28/17 0614  BP: 136/86 130/80  Pulse: 83 88  Resp: 16 16  Temp: 98 F (36.7 C) 97.9 F (36.6 C)   Got 3rd cytotec around 0600, cx still closed.  FHR Cat 1.  Sleeping now.  Place foley when cx o pens

## 2017-12-29 ENCOUNTER — Inpatient Hospital Stay (HOSPITAL_COMMUNITY): Payer: Medicaid Other | Admitting: Anesthesiology

## 2017-12-29 LAB — COMPREHENSIVE METABOLIC PANEL
ALT: 12 U/L — AB (ref 14–54)
AST: 25 U/L (ref 15–41)
Albumin: 2.6 g/dL — ABNORMAL LOW (ref 3.5–5.0)
Alkaline Phosphatase: 218 U/L — ABNORMAL HIGH (ref 38–126)
Anion gap: 9 (ref 5–15)
BUN: 6 mg/dL (ref 6–20)
CHLORIDE: 106 mmol/L (ref 101–111)
CO2: 20 mmol/L — AB (ref 22–32)
CREATININE: 0.64 mg/dL (ref 0.44–1.00)
Calcium: 8.4 mg/dL — ABNORMAL LOW (ref 8.9–10.3)
GFR calc non Af Amer: >60 mL/min (ref >60)
Glucose, Bld: 87 mg/dL (ref 65–99)
POTASSIUM: 3.8 mmol/L (ref 3.5–5.1)
SODIUM: 135 mmol/L (ref 135–145)
Total Bilirubin: 1 mg/dL (ref 0.3–1.2)
Total Protein: 6.1 g/dL — ABNORMAL LOW (ref 6.5–8.1)

## 2017-12-29 LAB — PROTEIN / CREATININE RATIO, URINE
Creatinine, Urine: 67 mg/dL
PROTEIN CREATININE RATIO: 1 mg/mg{creat} — AB (ref 0.00–0.15)
TOTAL PROTEIN, URINE: 67 mg/dL

## 2017-12-29 LAB — CBC
HCT: 29.4 % — ABNORMAL LOW (ref 36.0–46.0)
Hemoglobin: 9.1 g/dL — ABNORMAL LOW (ref 12.0–15.0)
MCH: 22.2 pg — AB (ref 26.0–34.0)
MCHC: 31 g/dL (ref 30.0–36.0)
MCV: 71.7 fL — ABNORMAL LOW (ref 78.0–100.0)
PLATELETS: 206 10*3/uL (ref 150–400)
RBC: 4.1 MIL/uL (ref 3.87–5.11)
RDW: 17.4 % — ABNORMAL HIGH (ref 11.5–15.5)
WBC: 9.6 10*3/uL (ref 4.0–10.5)

## 2017-12-29 MED ORDER — LACTATED RINGERS IV SOLN: 500.0000 mL | Freq: Once | INTRAVENOUS | Status: DC

## 2017-12-29 MED ORDER — HYDRALAZINE HCL 20 MG/ML IJ SOLN
10.0000 mg | Freq: Once | INTRAMUSCULAR | Status: AC | PRN
  Administered 2017-12-30: 10 mg via INTRAVENOUS
  Filled 2017-12-29 (×2): qty 1

## 2017-12-29 MED ORDER — LABETALOL HCL 5 MG/ML IV SOLN
INTRAVENOUS | Status: AC
  Filled 2017-12-29: qty 4

## 2017-12-29 MED ORDER — MISOPROSTOL 50MCG HALF TABLET
50.0000 ug | ORAL_TABLET | Freq: Once | ORAL | Status: AC
  Administered 2017-12-29: 50 ug via BUCCAL
  Filled 2017-12-29: qty 1

## 2017-12-29 MED ORDER — NALBUPHINE HCL 10 MG/ML IJ SOLN
2.5000 mg | Freq: Once | INTRAMUSCULAR | Status: AC
  Administered 2017-12-29: 2.5 mg via INTRAVENOUS
  Filled 2017-12-29: qty 1

## 2017-12-29 MED ORDER — LABETALOL HCL 5 MG/ML IV SOLN
20.0000 mg | INTRAVENOUS | Status: AC | PRN
  Administered 2017-12-29: 20 mg via INTRAVENOUS
  Administered 2017-12-29: 40 mg via INTRAVENOUS
  Administered 2017-12-30: 20 mg via INTRAVENOUS
  Filled 2017-12-29: qty 16
  Filled 2017-12-29: qty 8
  Filled 2017-12-29: qty 16

## 2017-12-29 MED ORDER — LIDOCAINE HCL (PF) 1 % IJ SOLN
INTRAMUSCULAR | Status: DC | PRN
  Administered 2017-12-29 (×2): 4 mL via EPIDURAL

## 2017-12-29 NOTE — Progress Notes (Signed)
   Kayla Franklin is a 27 y.o. G3P0020 at [redacted]w[redacted]d  admitted for induction of labor due to gestational hypertension.   Subjective: Resting better with epidural; still feeling some pain and pressure. Denies HA, blurry vision, floating spots, RUQ pain.   Objective: Vitals:   12/29/17 1900 12/29/17 1905 12/29/17 1910 12/29/17 1915  BP: (!) 173/98 (!) 173/103 (!) 160/95 (!) 161/95  Pulse: 79 78 82 81  Resp: 18 18 18 18   Temp:  97.9 F (36.6 C)    TempSrc:  Oral    SpO2: 99%     Weight:      Height:       No intake/output data recorded.  FHT:  FHR: 150  bpm, variability: minimal ,  accelerations:  Abscent,  decelerations:  Absent.  UC:   q4 minutes; SVE now 4.5/70/anterior/-2.  Labs: Lab Results  Component Value Date   WBC 9.6 12/29/2017   HGB 9.1 (L) 12/29/2017   HCT 29.4 (L) 12/29/2017   MCV 71.7 (L) 12/29/2017   PLT 206 12/29/2017    Assessment / Plan: Protracted latent phase; Patient has had minimal leaking of fluid; membranes ruptured again and small amount of clear fluid draining.   -Fetal position appears OP; will continue to rotate on peanut ball.   -Patient has had two doses of labetalol IV push for severe range pressures.    -CBC and CMP normal. Will draw UPC.   Labor:  Slow progress but now making cervical changes with AROM, epidural and pitocin.  Fetal Wellbeing: Category 2; RN at the bedside to reposition and closely monitor FHR.  Pain Control:  Epidural Anticipated MOD:  NSVD  Mervyn Skeeters [facility] 12/29/2017, 7:41 PM

## 2017-12-29 NOTE — Progress Notes (Addendum)
LABOR PROGRESS NOTE  Kayla Franklin is a 27 y.o. G3P0020 at [redacted]w[redacted]d  admitted for IOL d/t GHTN  Subjective: Patient feeling contractions regularly. She is uncomfortable  Objective: BP (!) 153/83   Pulse 83   Temp 97.8 F (36.6 C) (Oral)   Resp 18   Ht 5\' 3"  (1.6 m)   Wt 198 lb 14.4 oz (90.2 kg)   LMP 04/05/2017 (Exact Date)   BMI 35.23 kg/m  or  Vitals:   12/29/17 0112 12/29/17 0213 12/29/17 0300 12/29/17 0500  BP: (!) 141/89 (!) 136/95 (!) 152/80 (!) 153/83  Pulse: 72 75 78 83  Resp: 17 18    Temp:  97.8 F (36.6 C)    TempSrc:  Oral    Weight:      Height:        SVE: 4/40%/bollatable, posterior Exam is very difficult d/t patient's discomfort  FHT: baseline rate 150, moderate varibility, +acel, no decel Toco: Ctx q 2-3 min  Assessment / Plan: 26 y.o. G3P0020 at [redacted]w[redacted]d here for IOL d/t GTHN  Labor: Pt on Pitocin since 1600 yesterday with essentially no cervical change on Pitocin despite contracting regularly. Pitocin currently at 60mU/min. Will give Pitocin break. Allow patient to ambulate, shower, and eat. Will consider doing another dose of cytotec if contractions stop as Bishop score is only  4 Fetal Wellbeing:  Cat I Pain Control:  Labor support; not planning on epidural Anticipated MOD:  SVD  Gailen Shelter, MD 12/29/2017, 5:27 AM

## 2017-12-29 NOTE — Anesthesia Procedure Notes (Signed)
Epidural Patient location during procedure: OB Start time: 12/29/2017 6:43 PM  Preanesthetic Checklist Completed: patient identified, site marked, surgical consent, pre-op evaluation, timeout performed, IV checked, risks and benefits discussed and monitors and equipment checked  Epidural Patient position: sitting Prep: site prepped and draped and DuraPrep Patient monitoring: continuous pulse ox and blood pressure Approach: midline Location: L3-L4 Injection technique: LOR air  Needle:  Needle type: Tuohy  Needle gauge: 17 G Needle length: 9 cm and 9 Needle insertion depth: 5 cm cm Catheter type: closed end flexible Catheter size: 19 Gauge Catheter at skin depth: 10 cm Test dose: negative and Other  Assessment Events: blood not aspirated, injection not painful, no injection resistance, negative IV test and no paresthesia  Additional Notes Patient identified. Risks and benefits discussed including failed block, incomplete  Pain control, post dural puncture headache, nerve damage, paralysis, blood pressure Changes, nausea, vomiting, reactions to medications-both toxic and allergic and post Partum back pain. All questions were answered. Patient expressed understanding and wished to proceed. Sterile technique was used throughout procedure. Epidural site was Dressed with sterile barrier dressing. No paresthesias, signs of intravascular injection Or signs of intrathecal spread were encountered.  Patient was more comfortable after the epidural was dosed. Please see RN's note for documentation of vital signs and FHR which are stable.

## 2017-12-29 NOTE — Progress Notes (Signed)
   Kayla Franklin is a 27 y.o. G3P0020 at [redacted]w[redacted]d  admitted for [facility].   Subjective: Comfortable, does not epidural.   Objective: Vitals:   12/29/17 0814 12/29/17 0912 12/29/17 1052 12/29/17 1113  BP: (!) 140/93 (!) 137/92 (!) 140/101 (!) 141/97  Pulse: 93 79 85 82  Resp: 18 18 18 18   Temp:    97.6 F (36.4 C)  TempSrc:    Oral  Weight:      Height:       No intake/output data recorded.  FHT:  FHR: 150 bpm, variability: moderate,  accelerations:  Present,  decelerations:  Absent UC:   irregular, every 10 minutes SVE:   Dilation: 4.5 Effacement (%): 40 Station: -3 Exam by:: Cecelia Byars, RN  Labs: Lab Results  Component Value Date   WBC 7.5 12/27/2017   HGB 9.0 (L) 12/27/2017   HCT 28.8 (L) 12/27/2017   MCV 72.0 (L) 12/27/2017   PLT 207 12/27/2017    Assessment / Plan: Induction of labor due to gestational hypertension,  progressing well on pitocin Continue with pit break and give next dose of cytotec.  Labor: still in early labor, will continue with cytotec as patient only 40% effaced Fetal Wellbeing:  Category I Pain Control:  epidural Anticipated MOD:  NSVD   Will arom at next check.   Mervyn Skeeters Arzella Rehmann 12/29/2017, 11:18 AM

## 2017-12-29 NOTE — Progress Notes (Signed)
Labor Progress Note  Kayla Franklin is a 27 y.o. G3P0020 at [redacted]w[redacted]d  admitted for induction of labor due to Hypertension.  S: patient resting comfortably  Pain, PIH symptoms (HA, scotomata, RUQ pain)  O:  BP (!) 137/92   Pulse 79   Temp 98.6 F (37 C) (Oral)   Resp 18   Ht 5\' 3"  (1.6 m)   Wt 198 lb 14.4 oz (90.2 kg)   LMP 04/05/2017 (Exact Date)   BMI 35.23 kg/m   No intake/output data recorded.  FHT:  FHR: 140 bpm, variability: moderate,  accelerations:  Present,  decelerations:  Absent UC:  irregular SVE:   Dilation: 4.5 Effacement (%): 60 Station: -3 Exam by:: Dr. Lambert Keto  Labs: Lab Results  Component Value Date   WBC 7.5 12/27/2017   HGB 9.0 (L) 12/27/2017   HCT 28.8 (L) 12/27/2017   MCV 72.0 (L) 12/27/2017   PLT 207 12/27/2017    Assessment / Plan: 27 y.o. G3P0020 [redacted]w[redacted]d  in early/active labor Induction of labor due to gestational hypertension,  progressing well on pitocin  Labor: Progressing normally Fetal Wellbeing:  Category I Pain Control:  Epidural Anticipated MOD:  NSVD  Expectant management   Ralene Ok, MD

## 2017-12-29 NOTE — Progress Notes (Signed)
Patient not having any contractions at this time. Bishop score 4 Will give misoprostol 50 mcg po Recheck in 4 hours  [facility]. [place], MD OB Fellow

## 2017-12-29 NOTE — Progress Notes (Addendum)
   Kayla Franklin is a 27 y.o. G3P0020 at [redacted]w[redacted]d  admitted for induction of labor due to Hypertension.  Subjective: Comfortable; does not want epidural. Asymptomatic for pre-e.   Objective: Vitals:   12/29/17 1113 12/29/17 1216 12/29/17 1322 12/29/17 1415  BP: (!) 141/97 (!) 142/98 (!) 148/93   Pulse: 82 92 77   Resp: 18 20 16    Temp: 97.6 F (36.4 C)   98.6 F (37 C)  TempSrc: Oral   Oral  Weight:      Height:       No intake/output data recorded.  FHT:  FHR: 140 bpm, variability: moderate,  accelerations:  Present,  decelerations:  Absent UC:   irregular, every 10 minutes SVE:   Dilation: 4.5 Effacement (%): 40 Station: -3 Exam by:: K.Kooistra, CNM   Labs: Lab Results  Component Value Date   WBC 7.5 12/27/2017   HGB 9.0 (L) 12/27/2017   HCT 28.8 (L) 12/27/2017   MCV 72.0 (L) 12/27/2017   PLT 207 12/27/2017    Assessment / Plan: Protracted latent phase AROM clear fluid. Encouraged patient to ambulate.   BP still elevated but has not required treatment. Patient is asymptomatic.   Labor: prolonged early labor with little change overnight; patient does not tolerate cervical exams well at all. Will AROM; encourage ambulation. Restart pitocin at 1515.  Fetal Wellbeing:  Category I Pain Control:  Labor support without medications Anticipated MOD:  NSVD  Kayla Franklin 12/29/2017, 2:33 PM

## 2017-12-29 NOTE — Progress Notes (Signed)
LABOR PROGRESS NOTE  Kayla Franklin is a 27 y.o. G3P0020 at [redacted]w[redacted]d  admitted for IOL d/t GHTN  Subjective: Patient doing well. Feeling contractions, but pain is fairly well controlled with supportive measures and trying different positions.  Objective: BP (!) 141/89   Pulse 86   Temp 97.9 F (36.6 C) (Oral)   Resp 18   Ht 5\' 3"  (1.6 m)   Wt 198 lb 14.4 oz (90.2 kg)   LMP 04/05/2017 (Exact Date)   BMI 35.23 kg/m  or  Vitals:   12/28/17 1923 12/28/17 2207 12/28/17 2304 12/29/17 0011  BP: 135/78 (!) 141/85 (!) 149/86 (!) 141/89  Pulse: 84 83 81 86  Resp: 18  18   Temp: 98.6 F (37 C)  97.9 F (36.6 C)   TempSrc: Oral  Oral   Weight:      Height:        SVE: Dilation: 4.5 Effacement (%): 60 Cervical Position: Middle Station: -3 Presentation: Vertex Exam by:: Dr. Lambert Keto FHT: baseline rate 150, moderate varibility, +acel, no decel Toco: Ctx q~3-4 min  Assessment / Plan: 27 y.o. G3P0020 at [redacted]w[redacted]d here for IOL d/t GTHN  Labor: Minimal cervical change in the last 9 hours, still not in active labor. Continue to titrate IV Pitocin. Continue to try different positions to rotate baby, as it is OP Fetal Wellbeing:  Cat I Pain Control:  Labor support; not planning on epidural Anticipated MOD:  SVD  Gailen Shelter, MD 12/29/2017, 12:46 AM

## 2017-12-29 NOTE — Progress Notes (Signed)
Patient ID: Kayla Franklin, female   DOB: Jul 20, 1991, 27 y.o.   MRN: [id number] Doing well Vitals:   12/29/17 2000 12/29/17 2030 12/29/17 2100 12/29/17 2200  BP: (!) 146/84 128/69 (!) 141/80 132/72  Pulse: 100 82 82 87  Resp:      Temp:      TempSrc:      SpO2:      Weight:      Height:       FHR reassuring, small early decels UCs 257mvu but pattern not optimal  Dilation: 6 Effacement (%): 80 Cervical Position: Middle Station: -2, -1 Presentation: Vertex Exam by:: Dvontae Ruan CNM  Will increase Pit to improve pattern

## 2017-12-30 ENCOUNTER — Encounter (HOSPITAL_COMMUNITY): Payer: Self-pay | Admitting: *Deleted

## 2017-12-30 LAB — CBC
HEMATOCRIT: 25 % — AB (ref 36.0–46.0)
Hemoglobin: 7.9 g/dL — ABNORMAL LOW (ref 12.0–15.0)
MCH: 22.6 pg — ABNORMAL LOW (ref 26.0–34.0)
MCHC: 31.6 g/dL (ref 30.0–36.0)
MCV: 71.4 fL — AB (ref 78.0–100.0)
Platelets: 168 10*3/uL (ref 150–400)
RBC: 3.5 MIL/uL — ABNORMAL LOW (ref 3.87–5.11)
RDW: 17.4 % — AB (ref 11.5–15.5)
WBC: 15.3 10*3/uL — AB (ref 4.0–10.5)

## 2017-12-30 MED ORDER — SENNOSIDES-DOCUSATE SODIUM 8.6-50 MG PO TABS
2.0000 | ORAL_TABLET | ORAL | Status: DC
  Administered 2017-12-30 – 2017-12-31 (×2): 2 via ORAL
  Filled 2017-12-30 (×2): qty 2

## 2017-12-30 MED ORDER — MISOPROSTOL 200 MCG PO TABS
200.0000 ug | ORAL_TABLET | Freq: Once | ORAL | Status: DC
  Filled 2017-12-30: qty 1

## 2017-12-30 MED ORDER — MISOPROSTOL 200 MCG PO TABS
ORAL_TABLET | ORAL | Status: AC
  Filled 2017-12-30: qty 4

## 2017-12-30 MED ORDER — LABETALOL HCL 5 MG/ML IV SOLN: 20.0000 mg | INTRAVENOUS | Status: DC | PRN

## 2017-12-30 MED ORDER — ONDANSETRON HCL 4 MG/2ML IJ SOLN: 4.0000 mg | INTRAMUSCULAR | Status: DC | PRN

## 2017-12-30 MED ORDER — SODIUM CHLORIDE 0.9 % IV SOLN
2.0000 g | Freq: Four times a day (QID) | INTRAVENOUS | Status: AC
  Administered 2017-12-30 – 2018-01-01 (×8): 2 g via INTRAVENOUS
  Filled 2017-12-30: qty 2000
  Filled 2017-12-30 (×3): qty 2
  Filled 2017-12-30 (×3): qty 2000
  Filled 2017-12-30: qty 2

## 2017-12-30 MED ORDER — ACETAMINOPHEN 325 MG PO TABS
650.0000 mg | ORAL_TABLET | ORAL | Status: DC | PRN
  Administered 2017-12-30: 650 mg via ORAL
  Filled 2017-12-30: qty 2

## 2017-12-30 MED ORDER — MISOPROSTOL 200 MCG PO TABS
ORAL_TABLET | ORAL | Status: AC
  Filled 2017-12-30: qty 1

## 2017-12-30 MED ORDER — COCONUT OIL OIL
1.0000 "application " | TOPICAL_OIL | Status: DC | PRN
  Administered 2017-12-30: 1 via TOPICAL
  Filled 2017-12-30: qty 120

## 2017-12-30 MED ORDER — ZOLPIDEM TARTRATE 5 MG PO TABS: 5.0000 mg | ORAL_TABLET | Freq: Every evening | ORAL | Status: DC | PRN

## 2017-12-30 MED ORDER — MAGNESIUM SULFATE BOLUS VIA INFUSION
4.0000 g | Freq: Once | INTRAVENOUS | Status: AC
  Administered 2017-12-30: 4 g via INTRAVENOUS
  Filled 2017-12-30: qty 500

## 2017-12-30 MED ORDER — MAGNESIUM SULFATE 40 G IN LACTATED RINGERS - SIMPLE
2.0000 g/h | INTRAVENOUS | Status: AC
  Administered 2017-12-30 – 2017-12-31 (×2): 2 g/h via INTRAVENOUS
  Filled 2017-12-30 (×2): qty 40

## 2017-12-30 MED ORDER — WITCH HAZEL-GLYCERIN EX PADS: 1.0000 "application " | MEDICATED_PAD | CUTANEOUS | Status: DC | PRN

## 2017-12-30 MED ORDER — DIPHENHYDRAMINE HCL 25 MG PO CAPS: 25.0000 mg | ORAL_CAPSULE | Freq: Four times a day (QID) | ORAL | Status: DC | PRN

## 2017-12-30 MED ORDER — DIBUCAINE 1 % RE OINT: 1.0000 "application " | TOPICAL_OINTMENT | RECTAL | Status: DC | PRN

## 2017-12-30 MED ORDER — MISOPROSTOL 200 MCG PO TABS
ORAL_TABLET | ORAL | Status: AC
  Administered 2017-12-30: 200 ug
  Filled 2017-12-30: qty 2

## 2017-12-30 MED ORDER — IBUPROFEN 600 MG PO TABS
600.0000 mg | ORAL_TABLET | Freq: Four times a day (QID) | ORAL | Status: DC
  Administered 2017-12-30 – 2018-01-01 (×9): 600 mg via ORAL
  Filled 2017-12-30 (×9): qty 1

## 2017-12-30 MED ORDER — MISOPROSTOL 200 MCG PO TABS
400.0000 ug | ORAL_TABLET | Freq: Once | ORAL | Status: AC
  Administered 2017-12-30: 400 ug via BUCCAL

## 2017-12-30 MED ORDER — LACTATED RINGERS IV SOLN: INTRAVENOUS | Status: DC

## 2017-12-30 MED ORDER — SIMETHICONE 80 MG PO CHEW: 80.0000 mg | CHEWABLE_TABLET | ORAL | Status: DC | PRN

## 2017-12-30 MED ORDER — TETANUS-DIPHTH-ACELL PERTUSSIS 5-2.5-18.5 LF-MCG/0.5 IM SUSP: 0.5000 mL | Freq: Once | INTRAMUSCULAR | Status: DC

## 2017-12-30 MED ORDER — LABETALOL HCL 5 MG/ML IV SOLN
INTRAVENOUS | Status: AC
  Filled 2017-12-30: qty 16

## 2017-12-30 MED ORDER — PRENATAL MULTIVITAMIN CH
1.0000 | ORAL_TABLET | Freq: Every day | ORAL | Status: DC
  Administered 2017-12-30 – 2018-01-01 (×3): 1 via ORAL
  Filled 2017-12-30 (×3): qty 1

## 2017-12-30 MED ORDER — FERROUS SULFATE 325 (65 FE) MG PO TABS
325.0000 mg | ORAL_TABLET | Freq: Every day | ORAL | Status: DC
  Administered 2017-12-31 – 2018-01-01 (×2): 325 mg via ORAL
  Filled 2017-12-30 (×2): qty 1

## 2017-12-30 MED ORDER — ONDANSETRON HCL 4 MG PO TABS: 4.0000 mg | ORAL_TABLET | ORAL | Status: DC | PRN

## 2017-12-30 MED ORDER — DEXTROSE 5 % IV SOLN
160.0000 mg | Freq: Three times a day (TID) | INTRAVENOUS | Status: AC
  Administered 2017-12-30 – 2018-01-01 (×6): 160 mg via INTRAVENOUS
  Filled 2017-12-30 (×6): qty 4

## 2017-12-30 MED ORDER — MISOPROSTOL 200 MCG PO TABS: 200.0000 ug | ORAL_TABLET | Freq: Once | ORAL | Status: DC

## 2017-12-30 MED ORDER — BENZOCAINE-MENTHOL 20-0.5 % EX AERO
1.0000 "application " | INHALATION_SPRAY | CUTANEOUS | Status: DC | PRN
  Administered 2017-12-30 – 2018-01-01 (×2): 1 via TOPICAL
  Filled 2017-12-30 (×2): qty 56

## 2017-12-30 MED ORDER — HYDRALAZINE HCL 20 MG/ML IJ SOLN: 10.0000 mg | Freq: Once | INTRAMUSCULAR | Status: DC | PRN

## 2017-12-30 MED ORDER — PNV PRENATAL PLUS MULTIVITAMIN 27-1 MG PO TABS: 1.0000 | ORAL_TABLET | Freq: Every day | ORAL | Status: DC

## 2017-12-30 NOTE — Anesthesia Postprocedure Evaluation (Signed)
Anesthesia Post Note  Patient: Kayla Franklin  Procedure(s) Performed: AN AD HOC LABOR EPIDURAL     Patient location during evaluation: Mother Baby Anesthesia Type: Epidural Level of consciousness: awake and alert Pain management: pain level controlled Vital Signs Assessment: post-procedure vital signs reviewed and stable Respiratory status: spontaneous breathing, nonlabored ventilation and respiratory function stable Cardiovascular status: stable Postop Assessment: no headache, no backache and epidural receding Anesthetic complications: no    Last Vitals:  Vitals:   12/30/17 1128 12/30/17 1232  BP: (!) 144/89 130/84  Pulse: (!) 121 (!) 105  Resp: 18 17  Temp: 37.9 C (!) 38.1 C  SpO2: 98% 99%    Last Pain:  Vitals:   12/30/17 1232  TempSrc: Oral  PainSc:    Pain Goal: Patients Stated Pain Goal: 4 (12/30/17 0731)               Francis Dowse J

## 2017-12-30 NOTE — Anesthesia Postprocedure Evaluation (Signed)
Anesthesia Post Note  Patient: Kayla Franklin  Procedure(s) Performed: AN AD HOC LABOR EPIDURAL     Anesthesia Post Evaluation  Last Vitals:  Vitals:   12/30/17 1128 12/30/17 1232  BP: (!) 144/89 130/84  Pulse: (!) 121 (!) 105  Resp: 18 17  Temp: 37.9 C (!) 38.1 C  SpO2: 98% 99%    Last Pain:  Vitals:   12/30/17 1232  TempSrc: Oral  PainSc:    Pain Goal: Patients Stated Pain Goal: 4 (12/30/17 0731)               Francis Dowse J

## 2017-12-30 NOTE — Anesthesia Rounding Note (Signed)
  [facility] Epidural Rounding Note  Patient: Kayla Franklin, 27 y.o., female  Patient's current pain level: Pain Score: 2  (12/30/17 0924)  Agreed upon pain management level: 4  Epidural intervention: no  Comments:   Brittyn Salaz J 12/30/2017

## 2017-12-30 NOTE — Progress Notes (Signed)
ANTIBIOTIC CONSULT NOTE - INITIAL  Pharmacy Consult for Gentamicin Indication: maternal fever after delivery  No Known Allergies  Patient Measurements: Height: 5\' 3"  (160 cm) Weight: 198 lb 14.4 oz (90.2 kg) IBW/kg (Calculated) : 52.4 Adjusted Body Weight: 63.7  Vital Signs: Temp: 101.4 F (38.6 C) (02/17 1029) Temp Source: Oral (02/17 1029) BP: 133/69 (02/17 1029) Pulse Rate: 119 (02/17 1029)  Labs: Recent Labs    12/27/17 2102 12/27/17 2115 12/29/17 1741 12/29/17 1947 12/30/17 0600  WBC  --  7.5 9.6  --  15.3*  HGB  --  9.0* 9.1*  --  7.9*  PLT  --  207 206  --  168  LABCREA 271.00  --   --  67.00  --   CREATININE  --  0.60 0.64  --   --    No results for input(s): GENTTROUGH, GENTPEAK, GENTRANDOM in the last 72 hours.   Microbiology: Recent Results (from the past 720 hour(s))  OB RESULT CONSOLE Group B Strep     Status: None   Collection Time: 12/18/17 12:00 AM  Result Value Ref Range Status   GBS Positive  Final  Culture, beta strep (group b only)     Status: Abnormal   Collection Time: 12/18/17  3:45 PM  Result Value Ref Range Status   Strep Gp B Culture Positive (A) Negative Final    Comment: Centers for Disease Control and Prevention (CDC) and American Congress of Obstetricians and Gynecologists (ACOG) guidelines for prevention of perinatal group B streptococcal (GBS) disease specify co-collection of a vaginal and rectal swab specimen to maximize sensitivity of GBS detection. Per the CDC and ACOG, swabbing both the lower vagina and rectum substantially increases the yield of detection compared with sampling the vagina alone. Penicillin G, ampicillin, or cefazolin are indicated for intrapartum prophylaxis of perinatal GBS colonization. Reflex susceptibility testing should be performed prior to use of clindamycin only on GBS isolates from penicillin-allergic women who are considered a high risk for anaphylaxis. Treatment with vancomycin without additional  testing is warranted if resistance to clindamycin is noted.   GC/Chlamydia Probe Amp([facility])     Status: None   Collection Time: 12/18/17  3:45 PM  Result Value Ref Range Status   Chlamydia trachomatis, NAA Negative Negative Final   Neisseria gonorrhoeae by PCR Negative Negative Final    Medications:  Ampicillin 2g IV q6h  Assessment: 27 y.o. female G3P1021 at [redacted]w[redacted]d  Estimated Ke = 0.326, Vd = 24.2  Goal of Therapy:  Gentamicin peak 6-8 mg/L and Trough < 1 mg/L  Plan:   Gentamicin 160 mg IV every 8 hrs . Will check gentamicin levels if continued > 72hr or clinically indicated.  Vonda Antigua 12/30/2017,12:26 PM

## 2017-12-30 NOTE — Progress Notes (Signed)
Patient ID: Kayla Franklin, female   DOB: 1991/05/24, 27 y.o.   MRN: [id number] Feeling some pressure Vitals:   12/30/17 0100 12/30/17 0130 12/30/17 0200 12/30/17 0230  BP: (!) 152/86 128/80 (!) 156/88 (!) 162/104  Pulse: 98 99 94 99  Resp: 17     Temp: 98.8 F (37.1 C)     TempSrc: Oral     SpO2:    98%  Weight:      Height:       FHR with occasional variable decels now  Dilation: 10 Dilation Complete Date: 12/30/17 Dilation Complete Time: 0218 Effacement (%): 100 Cervical Position: Middle Station: +1 Presentation: Vertex Exam by:: Jimmye Norman, CNM  Will give a dose of Hydralazine Labor down for a while then start pushing

## 2017-12-31 DIAGNOSIS — O9081 Anemia of the puerperium: Secondary | ICD-10-CM | POA: Diagnosis present

## 2017-12-31 DIAGNOSIS — O864 Pyrexia of unknown origin following delivery: Secondary | ICD-10-CM | POA: Diagnosis not present

## 2017-12-31 LAB — TYPE AND SCREEN
ABO/RH(D): A NEG
ANTIBODY SCREEN: POSITIVE
Unit division: 0
Unit division: 0

## 2017-12-31 LAB — CBC
HCT: 20.4 % — ABNORMAL LOW (ref 36.0–46.0)
Hemoglobin: 6.6 g/dL — CL (ref 12.0–15.0)
MCH: 23 pg — ABNORMAL LOW (ref 26.0–34.0)
MCHC: 32.4 g/dL (ref 30.0–36.0)
MCV: 71.1 fL — AB (ref 78.0–100.0)
PLATELETS: 189 10*3/uL (ref 150–400)
RBC: 2.87 MIL/uL — AB (ref 3.87–5.11)
RDW: 17.7 % — AB (ref 11.5–15.5)
WBC: 12.7 10*3/uL — AB (ref 4.0–10.5)

## 2017-12-31 LAB — CULTURE, OB URINE: CULTURE: NO GROWTH

## 2017-12-31 LAB — BPAM RBC
BLOOD PRODUCT EXPIRATION DATE: 201903162359
Blood Product Expiration Date: 201903162359
UNIT TYPE AND RH: 600
UNIT TYPE AND RH: 600

## 2017-12-31 MED ORDER — SODIUM CHLORIDE 0.9 % IV SOLN
510.0000 mg | Freq: Once | INTRAVENOUS | Status: AC
  Administered 2017-12-31: 510 mg via INTRAVENOUS
  Filled 2017-12-31: qty 17

## 2017-12-31 NOTE — Progress Notes (Addendum)
CRITICAL VALUE ALERT  Critical Value:  Hemoglobin 6.6. Pt asymptomatic at this time. No shortness of breath or dizziness.   Date & Time Notied: 12/31/17 0554  Provider Notified: Dr. Kennon Rounds   Orders Received/Actions taken: No new orders at this time.

## 2017-12-31 NOTE — Progress Notes (Signed)
Post Partum Day 1 s/p SVD at [redacted]w[redacted]d after IOL for GHTN which progressed to severe preeclampsia.  Subjective: No complaints, voiding and tolerating PO. Denies LH, presyncopal symptoms but limited OOB for now due to magnesium sulfate. No headache, visual symptoms, RUQ/epigastric pain.  Baby is doing well at bedside, mother is breastfeeding.   Objective: Blood pressure 121/60, pulse 66, temperature 98.1 F (36.7 C), temperature source Oral, resp. rate 16, height 5\' 3"  (1.6 m), weight 198 lb 14.4 oz (90.2 kg), last menstrual period 04/05/2017, SpO2 100 %, unknown if currently breastfeeding. Patient Vitals for the past 24 hrs:  BP Temp Temp src Pulse Resp SpO2  12/31/17 0756 121/60 98.1 F (36.7 C) Oral 66 16 100 %  12/31/17 0700 - - - - 16 -  12/31/17 0600 - - - - 16 -  12/31/17 0500 - - - - 16 -  12/31/17 0400 111/79 97.7 F (36.5 C) Oral 78 15 99 %  12/31/17 0300 - - - - 16 -  12/31/17 0200 - - - - 18 -  12/31/17 0100 - - - - 18 -  12/31/17 0018 - - - - 16 -  12/30/17 2310 106/62 97.7 F (36.5 C) Oral 79 16 100 %  12/30/17 2200 - - - - 20 -  12/30/17 2100 - - - - 18 -  12/30/17 2003 - - - - 18 -  12/30/17 1939 125/80 (!) 97.3 F (36.3 C) Oral 78 18 100 %  12/30/17 1928 - - - - 18 -  12/30/17 1830 - - - - 20 -  12/30/17 1730 - - - - 17 -  12/30/17 1627 132/82 98.6 F (37 C) Oral 87 18 100 %  12/30/17 1447 (!) 115/94 99.9 F (37.7 C) Oral 84 17 98 %  12/30/17 1325 119/68 - - 96 - 97 %  12/30/17 1232 130/84 (!) 100.5 F (38.1 C) Oral (!) 105 17 99 %  12/30/17 1128 (!) 144/89 100.3 F (37.9 C) Oral (!) 121 18 98 %  12/30/17 1029 133/69 (!) 101.4 F (38.6 C) Oral (!) 119 20 99 %  12/30/17 0924 140/75 99.8 F (37.7 C) Oral (!) 107 19 99 %  12/30/17 0845 (!) 144/77 - - (!) 119 17 -  12/30/17 0830 (!) 145/90 - - (!) 115 18 -  12/30/17 0815 (!) 154/81 - - (!) 118 19 -  12/30/17 0807 (!) 152/87 - - (!) 116 19 -    Physical Exam:  General: alert and no distress  Lungs:  CTAB Heart: RRR Lochia: appropriate Uterine Fundus: NT, firm DVT Evaluation: No evidence of DVT seen on physical exam. Negative Homan's sign. No cords or calf tenderness.   CBC Latest Ref Rng & Units 12/31/2017 12/30/2017 12/29/2017  WBC 4.0 - 10.5 K/uL 12.7(H) 15.3(H) 9.6  Hemoglobin 12.0 - 15.0 g/dL 6.6(LL) 7.9(L) 9.1(L)  Hematocrit 36.0 - 46.0 % 20.4(L) 25.0(L) 29.4(L)  Platelets 150 - 400 K/uL 189 168 206  Cultures pending  Assessment/Plan: Principal Problem:   Severe preeclampsia, delivered Active Problems:   Postpartum care following vaginal delivery   Postpartum fever   Postpartum anemia  1) Stable BP for now, no medications. Magnesium sulfate will stop soon. Will monitor BP closely. 2) Last fever was around 1230 yesterday, continue antibiotics for now. Cultures pending. Nontender abdomen, nonfocal exam.  3) Patient has asymptomatic anemia for now. Will observe closely and see if she needs transfusion vs iron infusion.  Will see how she does with ambulation.  4) Continue other routine postpartum care; desires condoms for contraception.    LOS: 4 days   Verita Schneiders, MD 12/31/2017, 8:02 AM

## 2017-12-31 NOTE — Lactation Note (Signed)
This note was copied from a baby's chart. Lactation Consultation Note  Patient Name: Kayla Franklin [id number] Date: 12/31/2017 Reason for consult: Initial assessment   P1, Baby 62 hours old.  Reviewed hand expression with good flow expressed. Unwrapped baby for feeding.  She breastfed 2 hours ago. Mother placed baby in football position.  Baby was sleepy and did not latch so left baby STS on mother's chest. Earlier today mother pumped 5 ml of colostrum. Parents have been supplementing w/ breastmilk via finger syringe feeding. Answered questions and reviewed basics. Mom encouraged to feed baby 8-12 times/24 hours and with feeding cues.  Provided family with lactation brochure with support information. Maternal Data Has patient been taught Hand Expression?: Yes Does the patient have breastfeeding experience prior to this delivery?: No  Feeding Feeding Type: Breast Fed Length of feed: 10 min  LATCH Score Latch: Grasps breast easily, tongue down, lips flanged, rhythmical sucking.  Audible Swallowing: A few with stimulation  Type of Nipple: Everted at rest and after stimulation  Comfort (Breast/Nipple): Soft / non-tender  Hold (Positioning): No assistance needed to correctly position infant at breast.  LATCH Score: 9  Interventions Interventions: Breast feeding basics reviewed;Skin to skin;Hand express;DEBP;Breast compression  Lactation Tools Discussed/Used     Consult Status Consult Status: Follow-up Date: 01/01/18 Follow-up type: In-patient    Vivianne Master [facility] 12/31/2017, 1:34 PM

## 2018-01-01 ENCOUNTER — Institutional Professional Consult (permissible substitution): Payer: Medicaid Other | Admitting: Pediatrics

## 2018-01-01 MED ORDER — HYDROCHLOROTHIAZIDE 25 MG PO TABS
25.0000 mg | ORAL_TABLET | Freq: Every day | ORAL | Status: DC
  Administered 2018-01-01: 25 mg via ORAL
  Filled 2018-01-01: qty 1

## 2018-01-01 MED ORDER — HYDROCHLOROTHIAZIDE 25 MG PO TABS: 25.0000 mg | ORAL_TABLET | Freq: Every day | ORAL | 2 refills | Status: DC

## 2018-01-01 MED ORDER — FERROUS SULFATE 325 (65 FE) MG PO TABS: 325.0000 mg | ORAL_TABLET | Freq: Two times a day (BID) | ORAL | 3 refills | Status: DC

## 2018-01-01 MED ORDER — IBUPROFEN 600 MG PO TABS: 600.0000 mg | ORAL_TABLET | Freq: Four times a day (QID) | ORAL | 2 refills | Status: DC | PRN

## 2018-01-01 NOTE — Lactation Note (Signed)
This note was copied from a baby's chart. Lactation Consultation Note  Patient Name: Girl Gabryelle Whitmoyer Today's Date: 01/01/2018   During consult, "Costner" not showing evidence of transfer at the breast. At this time, infant is only able to obtain a shallow latch. Mom's nipples are everted, but short-shafted.   Mom admits that infant does not latch well to the R breast, but has a reliable history of good latching for the L breast (although infant acted the same on both breasts during my consult). B/c of Mom's reliable history, I gave Mom my # to call for me to return to reassess.   Labial frenum noted, but infant's top lip was noted to flange with ease. On oral exam, infant's palate & suck noted to be normal. Costner's last feeding was a finger-feeding of only 1mL of EBM (using a curved-tip syringe). Mom understands that infant needs more volume/feeding (closer to 20 mL).   Matthias Hughs [facility] 01/01/2018, 9:16 AM

## 2018-01-01 NOTE — Lactation Note (Signed)
This note was copied from a baby's chart. Lactation Consultation Note  Patient Name: Kayla Franklin [id number] Date: 01/01/2018 Reason for consult: Follow-up assessment  Gave update to Dr. Carolynn Sayers about feeding status.    Matthias Hughs [facility] 01/01/2018, 10:40 AM

## 2018-01-01 NOTE — Lactation Note (Signed)
This note was copied from a baby's chart. Lactation Consultation Note  Patient Name: Girl Sua Spadafora NBVAP'O Date: 01/01/2018 Reason for consult: Follow-up assessment  Infant latched much better with nipple shield (able to maintain latch, increased suction, no dimpling). However, still no evidence of milk transfer on R breast. Infant was supplemented at breast with curved-tip syringe using EBM & then formula to satisfy volume's needed for day of life (hand-out w/volume parameters provided, but parents understand to feed more if infant wants more).   Mom was able to demonstrate how to apply nipple shield. Father feels comfortable using curved-tip syringe at breast. Mom states that swallows heard during supplementing were the same as on the L breast yesterday. Mom understands that supplementing not necessary if there are frequent swallows. Hand expression was taught to Mom. Mom has a Medela DEBP at home.   Initially, a size 20 nipple shield was used, but it caused very minute abrasions on tip of Mom's nipple. The nipple shield was increased to a size 24 & Mom felt more comfortable & infant was able to accommodate the increased width.   Mom taught how to apply & clean nipple shield. Parents have our # to call.   Parents were show slight brick dust, uric acid crystals from previous diaper. Parents know to expect heavier wet diapers, especially after 72 hours.    Matthias Hughs [facility] 01/01/2018, 3:00 PM

## 2018-01-01 NOTE — Lactation Note (Signed)
This note was copied from a baby's chart. Lactation Consultation Note  Patient Name: Kayla Franklin [id number] Date: 01/01/2018 Reason for consult: Follow-up assessment  Mom had finger-fed infant 43ml of EBM b/c infant wouldn't latch. Mom to call when infant awakes. Mom knows to wake infant at 31 if infant doesn't wake before.    Matthias Hughs [facility] 01/01/2018, 11:19 AM

## 2018-01-01 NOTE — Discharge Summary (Signed)
OB Discharge Summary     Patient Name: Kayla Franklin DOB: July 06, 1991 MRN: [id number]  Date of admission: 12/27/2017 Delivering MD: Seabron Spates   Date of discharge: 01/01/2018  Admitting diagnosis: 27WKS HBP Intrauterine pregnancy: [redacted]w[redacted]d     Secondary diagnosis:  Principal Problem:   Severe preeclampsia, delivered Active Problems:   Postpartum care following vaginal delivery   Postpartum fever   Postpartum anemia  Additional problems: None     Discharge diagnosis: Term Pregnancy Delivered and Preeclampsia (severe)                                                                                                Post partum procedures:Magnesium sulfate for eclampsia prophylaxis  Augmentation: Pitocin, Cytotec and Foley Balloon  Complications: None  Hospital course:  Induction of Labor With Vaginal Delivery   27 y.o. yo [id number] at [redacted]w[redacted]d was admitted to the hospital 12/27/2017 for induction of labor.  Indication for induction: Gestational hypertension that progressed to Severe Preeclampsia. She had magnesium sulfate for eclampsia prophylaxis and required a couple of IV doses of Labetalol for BP control.  Patient had an uncomplicated labor course as follows: Membrane Rupture Time/Date: 2:15 PM ,12/29/2017   Intrapartum Procedures: Episiotomy: None [1]                                         Lacerations:  1st degree [2];Perineal [11]  Patient had delivery of a Viable infant.  Information for the patient's newborn:  Tynesha, Free [[id number]]  Delivery Method: Vag-Spont on 12/30/2017  Details of delivery can be found in separate delivery note.  Patient was on magnesium sulfate for 24 hours post delivery.  On PPD#2, she was started on HCTZ for BP control but otherwise had a routine postpartum course. Patient is discharged home 01/01/18 with plans to follow up in the office in one week.   Physical exam  Vitals:   12/31/17 2100 01/01/18 0018 01/01/18 0430 01/01/18 0805  BP:  134/81 (!) 141/94 132/75 (!) 150/90  Pulse: 80 77 74 75  Resp: 16 16 16 16   Temp: 98.5 F (36.9 C) 98.9 F (37.2 C) 98.2 F (36.8 C) 98.4 F (36.9 C)  TempSrc: Oral Oral Oral Oral  SpO2: 100% 100% 100% 99%  Weight:      Height:       General: alert, cooperative and no distress Lochia: appropriate Uterine Fundus: firm DVT Evaluation: No evidence of DVT seen on physical exam. Negative Homan's sign. No cords or calf tenderness. Labs: Lab Results  Component Value Date   WBC 12.7 (H) 12/31/2017   HGB 6.6 (LL) 12/31/2017   HCT 20.4 (L) 12/31/2017   MCV 71.1 (L) 12/31/2017   PLT 189 12/31/2017   CMP Latest Ref Rng & Units 12/29/2017  Glucose 65 - 99 mg/dL 87  BUN 6 - 20 mg/dL 6  Creatinine 0.44 - 1.00 mg/dL 0.64  Sodium 135 - 145 mmol/L 135  Potassium 3.5 - 5.1 mmol/L 3.8  Chloride 101 -  111 mmol/L 106  CO2 22 - 32 mmol/L 20(L)  Calcium 8.9 - 10.3 mg/dL 8.4(L)  Total Protein 6.5 - 8.1 g/dL 6.1(L)  Total Bilirubin 0.3 - 1.2 mg/dL 1.0  Alkaline Phos 38 - 126 U/L 218(H)  AST 15 - 41 U/L 25  ALT 14 - 54 U/L 12(L)    Discharge instruction: per After Visit Summary and "Baby and Me Booklet".  After visit meds:  Allergies as of 01/01/2018   No Known Allergies     Medication List    TAKE these medications   aspirin EC 81 MG tablet Take 1 tablet (81 mg total) by mouth daily.   Carpal Tunnel Wrist Stabilizer Misc 2 Devices by Does not apply route as directed.   ferrous sulfate 325 (65 FE) MG tablet Take 1 tablet (325 mg total) by mouth 2 (two) times daily with a meal. What changed:  when to take this   hydrochlorothiazide 25 MG tablet Commonly known as:  HYDRODIURIL Take 1 tablet (25 mg total) by mouth daily.   ibuprofen 600 MG tablet Commonly known as:  ADVIL,MOTRIN Take 1 tablet (600 mg total) by mouth every 6 (six) hours as needed for fever, headache, mild pain, moderate pain or cramping.   PNV PRENATAL PLUS MULTIVITAMIN 27-1 MG Tabs Take 1 tablet by mouth  daily.       Diet: routine diet  Activity: Advance as tolerated. Pelvic rest for 6 weeks.   Outpatient follow up: 1 week Follow up Appt: Future Appointments  Date Time Provider Department Center  01/04/2018 12:45 PM Florian Buff, MD [facility]   Postpartum contraception: Condoms  Newborn Data: Live born female  Birth Weight: 6 lb 14.6 oz (3135 g) APGAR: 7, 9  Newborn Delivery   Birth date/time:  12/30/2017 04:37:00 Delivery type:  Vaginal, Spontaneous     Baby Feeding: Breast Disposition:home with mother   01/01/2018 Verita Schneiders, MD

## 2018-01-01 NOTE — Discharge Instructions (Signed)
Vaginal Delivery, Care After °Refer to this sheet in the next few weeks. These instructions provide you with information about caring for yourself after vaginal delivery. Your health care provider may also give you more specific instructions. Your treatment has been planned according to current medical practices, but problems sometimes occur. Call your health care provider if you have any problems or questions. °What can I expect after the procedure? °After vaginal delivery, it is common to have: °· Some bleeding from your vagina. °· Soreness in your abdomen, your vagina, and the area of skin between your vaginal opening and your anus (perineum). °· Pelvic cramps. °· Fatigue. ° °Follow these instructions at home: °Medicines °· Take over-the-counter and prescription medicines only as told by your health care provider. °· If you were prescribed an antibiotic medicine, take it as told by your health care provider. Do not stop taking the antibiotic until it is finished. °Driving ° °· Do not drive or operate heavy machinery while taking prescription pain medicine. °· Do not drive for 24 hours if you received a sedative. °Lifestyle °· Do not drink alcohol. This is especially important if you are breastfeeding or taking medicine to relieve pain. °· Do not use tobacco products, including cigarettes, chewing tobacco, or e-cigarettes. If you need help quitting, ask your health care provider. °Eating and drinking °· Drink at least 8 eight-ounce glasses of water every day unless you are told not to by your health care provider. If you choose to breastfeed your baby, you may need to drink more water than this. °· Eat high-fiber foods every day. These foods may help prevent or relieve constipation. High-fiber foods include: °? Whole grain cereals and breads. °? Brown rice. °? Beans. °? Fresh fruits and vegetables. °Activity °· Return to your normal activities as told by your health care provider. Ask your health care provider  what activities are safe for you. °· Rest as much as possible. Try to rest or take a nap when your baby is sleeping. °· Do not lift anything that is heavier than your baby or 10 lb (4.5 kg) until your health care provider says that it is safe. °· Talk with your health care provider about when you can engage in sexual activity. This may depend on your: °? Risk of infection. °? Rate of healing. °? Comfort and desire to engage in sexual activity. °Vaginal Care °· If you have an episiotomy or a vaginal tear, check the area every day for signs of infection. Check for: °? More redness, swelling, or pain. °? More fluid or blood. °? Warmth. °? Pus or a bad smell. °· Do not use tampons or douches until your health care provider says this is safe. °· Watch for any blood clots that may pass from your vagina. These may look like clumps of dark red, brown, or black discharge. °General instructions °· Keep your perineum clean and dry as told by your health care provider. °· Wear loose, comfortable clothing. °· Wipe from front to back when you use the toilet. °· Ask your health care provider if you can shower or take a bath. If you had an episiotomy or a perineal tear during labor and delivery, your health care provider may tell you not to take baths for a certain length of time. °· Wear a bra that supports your breasts and fits you well. °· If possible, have someone help you with household activities and help care for your baby for at least a few days after   you leave the hospital. °· Keep all follow-up visits for you and your baby as told by your health care provider. This is important. °Contact a health care provider if: °· You have: °? Vaginal discharge that has a bad smell. °? Difficulty urinating. °? Pain when urinating. °? A sudden increase or decrease in the frequency of your bowel movements. °? More redness, swelling, or pain around your episiotomy or vaginal tear. °? More fluid or blood coming from your episiotomy or  vaginal tear. °? Pus or a bad smell coming from your episiotomy or vaginal tear. °? A fever. °? A rash. °? Little or no interest in activities you used to enjoy. °? Questions about caring for yourself or your baby. °· Your episiotomy or vaginal tear feels warm to the touch. °· Your episiotomy or vaginal tear is separating or does not appear to be healing. °· Your breasts are painful, hard, or turn red. °· You feel unusually sad or worried. °· You feel nauseous or you vomit. °· You pass large blood clots from your vagina. If you pass a blood clot from your vagina, save it to show to your health care provider. Do not flush blood clots down the toilet without having your health care provider look at them. °· You urinate more than usual. °· You are dizzy or light-headed. °· You have not breastfed at all and you have not had a menstrual period for 12 weeks after delivery. °· You have stopped breastfeeding and you have not had a menstrual period for 12 weeks after you stopped breastfeeding. °Get help right away if: °· You have: °? Pain that does not go away or does not get better with medicine. °? Chest pain. °? Difficulty breathing. °? Blurred vision or spots in your vision. °? Thoughts about hurting yourself or your baby. °· You develop pain in your abdomen or in one of your legs. °· You develop a severe headache. °· You faint. °· You bleed from your vagina so much that you fill two sanitary pads in one hour. °This information is not intended to replace advice given to you by your health care provider. Make sure you discuss any questions you have with your health care provider. °Document Released: 10/27/2000 Document Revised: 04/12/2016 Document Reviewed: 11/14/2015 °Elsevier Interactive Patient Education © 2018 [employer] ° ° ° °Preeclampsia and Eclampsia °Preeclampsia is a serious condition that develops only during pregnancy. It is also called toxemia of pregnancy. This condition causes high blood pressure along  with other symptoms, such as swelling and headaches. These symptoms may develop as the condition gets worse. Preeclampsia may occur at 20 weeks of pregnancy or later. °Diagnosing and treating preeclampsia early is very important. If not treated early, it can cause serious problems for you and your baby. One problem it can lead to is eclampsia, which is a condition that causes muscle jerking or shaking (convulsions or seizures) in the mother. Delivering your baby is the best treatment for preeclampsia or eclampsia. Preeclampsia and eclampsia symptoms usually go away after your baby is born. °What are the causes? °The cause of preeclampsia is not known. °What increases the risk? °The following risk factors make you more likely to develop preeclampsia: °· Being pregnant for the first time. °· Having had preeclampsia during a past pregnancy. °· Having a family history of preeclampsia. °· Having high blood pressure. °· Being pregnant with twins or triplets. °· Being 35 or older. °· Being African-American. °· Having kidney disease or diabetes. °·   Having medical conditions such as lupus or blood diseases. °· Being very overweight (obese). ° °What are the signs or symptoms? °The earliest signs of preeclampsia are: °· High blood pressure. °· Increased protein in your urine. Your health care provider will check for this at every visit before you give birth (prenatal visit). ° °Other symptoms that may develop as the condition gets worse include: °· Severe headaches. °· Sudden weight gain. °· Swelling of the hands, face, legs, and feet. °· Nausea and vomiting. °· Vision problems, such as blurred or double vision. °· Numbness in the face, arms, legs, and feet. °· Urinating less than usual. °· Dizziness. °· Slurred speech. °· Abdominal pain, especially upper abdominal pain. °· Convulsions or seizures. ° °Symptoms generally go away after giving birth. °How is this diagnosed? °There are no screening tests for preeclampsia. Your  health care provider will ask you about symptoms and check for signs of preeclampsia during your prenatal visits. You may also have tests that include: °· Urine tests. °· Blood tests. °· Checking your blood pressure. °· Monitoring your baby’s heart rate. °· Ultrasound. ° °How is this treated? °You and your health care provider will determine the treatment approach that is best for you. Treatment may include: °· Having more frequent prenatal exams to check for signs of preeclampsia, if you have an increased risk for preeclampsia. °· Bed rest. °· Reducing how much salt (sodium) you eat. °· Medicine to lower your blood pressure. °· Staying in the hospital, if your condition is severe. There, treatment will focus on controlling your blood pressure and the amount of fluids in your body (fluid retention). °· You may need to take medicine (magnesium sulfate) to prevent seizures. This medicine may be given as an injection or through an IV tube. °· Delivering your baby early, if your condition gets worse. You may have your labor started with medicine (induced), or you may have a cesarean delivery. ° °Follow these instructions at home: °Eating and drinking ° °· Drink enough fluid to keep your urine clear or pale yellow. °· Eat a healthy diet that is low in sodium. Do not add salt to your food. Check nutrition labels to see how much sodium a food or beverage contains. °· Avoid caffeine. °Lifestyle °· Do not use any products that contain nicotine or tobacco, such as cigarettes and e-cigarettes. If you need help quitting, ask your health care provider. °· Do not use alcohol or drugs. °· Avoid stress as much as possible. Rest and get plenty of sleep. °General instructions °· Take over-the-counter and prescription medicines only as told by your health care provider. °· When lying down, lie on your side. This keeps pressure off of your baby. °· When sitting or lying down, raise (elevate) your feet. Try putting some pillows  underneath your lower legs. °· Exercise regularly. Ask your health care provider what kinds of exercise are best for you. °· Keep all follow-up and prenatal visits as told by your health care provider. This is important. °How is this prevented? °To prevent preeclampsia or eclampsia from developing during another pregnancy: °· Get proper medical care during pregnancy. Your health care provider may be able to prevent preeclampsia or diagnose and treat it early. °· Your health care provider may have you take a low-dose aspirin or a calcium supplement during your next pregnancy. °· You may have tests of your blood pressure and kidney function after giving birth. °· Maintain a healthy weight. Ask your health care provider for   help managing weight gain during pregnancy. °· Work with your health care provider to manage any long-term (chronic) health conditions you have, such as diabetes or kidney problems. ° °Contact a health care provider if: °· You gain more weight than expected. °· You have headaches. °· You have nausea or vomiting. °· You have abdominal pain. °· You feel dizzy or light-headed. °Get help right away if: °· You develop sudden or severe swelling anywhere in your body. This usually happens in the legs. °· You gain 5 lbs (2.3 kg) or more during one week. °· You have severe: °? Abdominal pain. °? Headaches. °? Dizziness. °? Vision problems. °? Confusion. °? Nausea or vomiting. °· You have a seizure. °· You have trouble moving any part of your body. °· You develop numbness in any part of your body. °· You have trouble speaking. °· You have any abnormal bleeding. °· You pass out. °This information is not intended to replace advice given to you by your health care provider. Make sure you discuss any questions you have with your health care provider. °Document Released: 10/27/2000 Document Revised: 06/27/2016 Document Reviewed: 06/05/2016 °Elsevier Interactive Patient Education © 2018 Elsevier Inc. ° °

## 2018-01-01 NOTE — Progress Notes (Signed)
Discharge instructions given. Questions answered. Pt states understanding. Signs and given copy

## 2018-01-01 NOTE — Lactation Note (Signed)
This note was copied from a baby's chart. Lactation Consultation Note  Patient Name: Kayla Franklin GXIVH'S Date: 01/01/2018 Reason for consult: Follow-up assessment  Mom had attempted to latch. Infant continuing to show dimpling when suckling with no signs of milk transfer. Infant fell asleep.   Nipple shield brought into room. Mom to call when infant ready.    Matthias Hughs [facility] 01/01/2018, 9:42 AM

## 2018-01-04 ENCOUNTER — Ambulatory Visit (INDEPENDENT_AMBULATORY_CARE_PROVIDER_SITE_OTHER): Payer: Medicaid Other | Admitting: Obstetrics & Gynecology

## 2018-01-04 ENCOUNTER — Encounter: Payer: Self-pay | Admitting: Obstetrics & Gynecology

## 2018-01-04 VITALS — BP 160/100 | HR 98 | Wt 175.0 lb

## 2018-01-04 DIAGNOSIS — Z0130 Encounter for examination of blood pressure without abnormal findings: Secondary | ICD-10-CM | POA: Diagnosis not present

## 2018-01-04 LAB — CULTURE, BLOOD (ROUTINE X 2)
CULTURE: NO GROWTH
Culture: NO GROWTH
SPECIAL REQUESTS: ADEQUATE
Special Requests: ADEQUATE

## 2018-01-04 MED ORDER — AMLODIPINE BESYLATE 10 MG PO TABS: 10.0000 mg | ORAL_TABLET | Freq: Every day | ORAL | 1 refills | Status: DC

## 2018-01-04 NOTE — Progress Notes (Signed)
Chief Complaint  Patient presents with  . Blood Pressure Check      27 y.o. [id number] Patient's last menstrual period was 04/05/2017 (exact date). The current method of family planning is none.  Outpatient Encounter Medications as of 01/04/2018  Medication Sig  . ferrous sulfate 325 (65 FE) MG tablet Take 1 tablet (325 mg total) by mouth 2 (two) times daily with a meal.  . hydrochlorothiazide (HYDRODIURIL) 25 MG tablet Take 1 tablet (25 mg total) by mouth daily.  Marland Kitchen ibuprofen (ADVIL,MOTRIN) 600 MG tablet Take 1 tablet (600 mg total) by mouth every 6 (six) hours as needed for fever, headache, mild pain, moderate pain or cramping.  Marland Kitchen amLODipine (NORVASC) 10 MG tablet Take 1 tablet (10 mg total) by mouth daily.  Marland Kitchen aspirin EC 81 MG tablet Take 1 tablet (81 mg total) by mouth daily.  Regino Schultze Bandages & Supports (CARPAL TUNNEL WRIST STABILIZER) MISC 2 Devices by Does not apply route as directed. (Patient not taking: Reported on 01/04/2018)  . Prenatal Vit-Fe Fumarate-FA (PNV PRENATAL PLUS MULTIVITAMIN) 27-1 MG TABS Take 1 tablet by mouth daily. (Patient not taking: Reported on 01/04/2018)   No facility-administered encounter medications on file as of 01/04/2018.     Subjective Chavon Lucarelli is in today for blood pressure check Her last week with gestational hypertension Discharged on no medications She is breast-feeding which is going well  Her blood pressure today is elevated at 160/100  She denies headache or other CNS symptoms  Past Medical History:  Diagnosis Date  . Heart murmur   . Heart murmur   . Hypertension 03/19/2013  . Substance abuse ([place])   . Trichimoniasis   . UTI (lower urinary tract infection)     Past Surgical History:  Procedure Laterality Date  . CYST EXCISION     nasal cyst    OB History    Gravida Para Term Preterm AB Living   3 1 1   2 1    SAB TAB Ectopic Multiple Live Births     1   0 1      No Known Allergies  Social History    Socioeconomic History  . Marital status: Single    Spouse name: None  . Number of children: None  . Years of education: None  . Highest education level: None  Social Needs  . Financial resource strain: None  . Food insecurity - worry: None  . Food insecurity - inability: None  . Transportation needs - medical: None  . Transportation needs - non-medical: None  Occupational History  . None  Tobacco Use  . Smoking status: Former Smoker    Packs/day: 0.10    Years: 6.00    Pack years: 0.60    Types: Cigarettes  . Smokeless tobacco: Never Used  Substance and Sexual Activity  . Alcohol use: No    Frequency: Never    Comment: occassional before pregnancy  . Drug use: No    Comment: last used 2 years ago  . Sexual activity: Not Currently    Birth control/protection: None  Other Topics Concern  . None  Social History Narrative  . None    Family History  Problem Relation Age of Onset  . Heart disease Father        CHF  . Hypertension Father   . Hypertension Sister   . Hypertension Mother   . Hypertension Paternal Grandmother   . Diabetes Other  great grandmother    Medications:       Current Outpatient Medications:  .  ferrous sulfate 325 (65 FE) MG tablet, Take 1 tablet (325 mg total) by mouth 2 (two) times daily with a meal., Disp: 60 tablet, Rfl: 3 .  hydrochlorothiazide (HYDRODIURIL) 25 MG tablet, Take 1 tablet (25 mg total) by mouth daily., Disp: 30 tablet, Rfl: 2 .  ibuprofen (ADVIL,MOTRIN) 600 MG tablet, Take 1 tablet (600 mg total) by mouth every 6 (six) hours as needed for fever, headache, mild pain, moderate pain or cramping., Disp: 60 tablet, Rfl: 2 .  amLODipine (NORVASC) 10 MG tablet, Take 1 tablet (10 mg total) by mouth daily., Disp: 30 tablet, Rfl: 1 .  aspirin EC 81 MG tablet, Take 1 tablet (81 mg total) by mouth daily., Disp: 30 tablet, Rfl: 6 .  Elastic Bandages & Supports (CARPAL TUNNEL WRIST STABILIZER) MISC, 2 Devices by Does not apply route  as directed. (Patient not taking: Reported on 01/04/2018), Disp: 2 each, Rfl: 0 .  Prenatal Vit-Fe Fumarate-FA (PNV PRENATAL PLUS MULTIVITAMIN) 27-1 MG TABS, Take 1 tablet by mouth daily. (Patient not taking: Reported on 01/04/2018), Disp: 30 tablet, Rfl: 11  Objective Blood pressure (!) 160/100, pulse 98, weight 175 lb (79.4 kg), last menstrual period 04/05/2017, unknown if currently breastfeeding.  Gen well-developed well-nourished female no acute distress  Pertinent ROS No burning with urination, frequency or urgency No nausea, vomiting or diarrhea Nor fever chills or other constitutional symptoms   Labs or studies     Impression Diagnoses this Encounter::   ICD-10-CM   1. BP check Z01.30     Established relevant diagnosis(es): Patient hypertension with recent delivery  Plan/Recommendations: Meds ordered this encounter  Medications  . amLODipine (NORVASC) 10 MG tablet    Sig: Take 1 tablet (10 mg total) by mouth daily.    Dispense:  30 tablet    Refill:  1    Labs or Scans Ordered: No orders of the defined types were placed in this encounter.   Management:: Patient is begun on Norvasc 10 and we will see her back in 4 weeks for follow-up  Follow up Return in about 4 weeks (around 02/01/2018) for post partum visit.     All questions were answered.

## 2018-01-06 ENCOUNTER — Encounter: Payer: Self-pay | Admitting: Obstetrics & Gynecology

## 2018-01-18 ENCOUNTER — Encounter: Payer: Self-pay | Admitting: Obstetrics & Gynecology

## 2018-01-29 ENCOUNTER — Encounter: Payer: Self-pay | Admitting: Obstetrics & Gynecology

## 2018-01-30 ENCOUNTER — Encounter: Payer: Self-pay | Admitting: Obstetrics & Gynecology

## 2018-02-01 ENCOUNTER — Ambulatory Visit: Payer: Self-pay | Admitting: Women's Health

## 2018-02-13 ENCOUNTER — Ambulatory Visit (INDEPENDENT_AMBULATORY_CARE_PROVIDER_SITE_OTHER): Payer: Medicaid Other | Admitting: Advanced Practice Midwife

## 2018-02-13 ENCOUNTER — Encounter: Payer: Self-pay | Admitting: Advanced Practice Midwife

## 2018-02-13 ENCOUNTER — Other Ambulatory Visit: Payer: Self-pay | Admitting: Advanced Practice Midwife

## 2018-02-13 DIAGNOSIS — O9081 Anemia of the puerperium: Secondary | ICD-10-CM | POA: Diagnosis not present

## 2018-02-13 LAB — POCT HEMOGLOBIN: HEMOGLOBIN: 11.5 g/dL — AB (ref 12.2–16.2)

## 2018-02-13 NOTE — Progress Notes (Signed)
Nikitha Blevins is a 27 y.o. who presents for a postpartum visit. She is 6 weeks postpartum following a spontaneous vaginal delivery. I have fully reviewed the prenatal and intrapartum course. The delivery was at 38.3 gestational weeks.  Anesthesia: epidural. Postpartum course has been complicated by persistent HTN, placed on Norvasc 10mg  1 week after delivery.  .. Baby's course has been uneventful. Baby is feeding by bottle. Bleeding: may have started period, not sure .Bleeding now. Bowel function is normal. Bladder function is normal. Patient is not sexually active. Contraception method is condoms. Postpartum depression screening: negative. Did not take Norvasc today.   Current Outpatient Medications:  .  amLODipine (NORVASC) 10 MG tablet, Take 1 tablet (10 mg total) by mouth daily., Disp: 30 tablet, Rfl: 1 .  aspirin EC 81 MG tablet, Take 1 tablet (81 mg total) by mouth daily., Disp: 30 tablet, Rfl: 6 .  ferrous sulfate 325 (65 FE) MG tablet, Take 1 tablet (325 mg total) by mouth 2 (two) times daily with a meal., Disp: 60 tablet, Rfl: 3 .  hydrochlorothiazide (HYDRODIURIL) 25 MG tablet, Take 1 tablet (25 mg total) by mouth daily., Disp: 30 tablet, Rfl: 2 .  ibuprofen (ADVIL,MOTRIN) 600 MG tablet, Take 1 tablet (600 mg total) by mouth every 6 (six) hours as needed for fever, headache, mild pain, moderate pain or cramping., Disp: 60 tablet, Rfl: 2 .  Prenatal Vit-Fe Fumarate-FA (PNV PRENATAL PLUS MULTIVITAMIN) 27-1 MG TABS, Take 1 tablet by mouth daily., Disp: 30 tablet, Rfl: 11 .  Elastic Bandages & Supports (CARPAL TUNNEL WRIST STABILIZER) MISC, 2 Devices by Does not apply route as directed. (Patient not taking: Reported on 01/04/2018), Disp: 2 each, Rfl: 0  Review of Systems   Constitutional: Negative for fever and chills Eyes: Negative for visual disturbances Respiratory: Negative for shortness of breath, dyspnea Cardiovascular: Negative for chest pain or palpitations  Gastrointestinal:  Negative for vomiting, diarrhea and constipation Genitourinary: Negative for dysuria and urgency Musculoskeletal: Negative for back pain, joint pain, myalgias  Neurological: Negative for dizziness and headaches    Objective:     Vitals:   02/13/18 1457  BP: (!) 140/110  Pulse: 99   General:  alert, cooperative and no distress   Breasts:  negative  Lungs: clear to auscultation bilaterally  Heart:  regular rate and rhythm  Abdomen: Soft, nontender   Vulva:  normal  Vagina: normal vagina  Cervix:  closed  Corpus: Well involuted     Rectal Exam: no hemorrhoids        Assessment:    normal postpartum exam. Persistent GHTN vs CHTN Plan:   1. Contraception: condoms 2. Follow up in:  4 weeks for BP check.  Stop meds 2-3 days before

## 2018-02-13 NOTE — Addendum Note (Signed)
Addended by: Diona Fanti A on: 02/13/2018 03:55 PM   Modules accepted: Orders

## 2018-03-08 ENCOUNTER — Encounter: Payer: Self-pay | Admitting: Obstetrics & Gynecology

## 2018-03-13 ENCOUNTER — Ambulatory Visit: Payer: Self-pay | Admitting: Advanced Practice Midwife

## 2024-07-01 ENCOUNTER — Ambulatory Visit (INDEPENDENT_AMBULATORY_CARE_PROVIDER_SITE_OTHER): Payer: Self-pay | Admitting: Adult Health

## 2024-07-01 ENCOUNTER — Other Ambulatory Visit (HOSPITAL_COMMUNITY)
Admission: RE | Admit: 2024-07-01 | Discharge: 2024-07-01 | Disposition: A | Discharge status: Home / Self Care | Source: Ambulatory Visit | Attending: Obstetrics & Gynecology | Admitting: Obstetrics & Gynecology

## 2024-07-01 ENCOUNTER — Encounter: Payer: Self-pay | Admitting: Adult Health

## 2024-07-01 VITALS — BP 139/89 | HR 66 | Ht 63.0 in | Wt 178.0 lb

## 2024-07-01 DIAGNOSIS — Z862 Personal history of diseases of the blood and blood-forming organs and certain disorders involving the immune mechanism: Secondary | ICD-10-CM

## 2024-07-01 DIAGNOSIS — Z01419 Encounter for gynecological examination (general) (routine) without abnormal findings: Secondary | ICD-10-CM | POA: Insufficient documentation

## 2024-07-01 DIAGNOSIS — I10 Essential (primary) hypertension: Secondary | ICD-10-CM

## 2024-07-01 DIAGNOSIS — Z13220 Encounter for screening for lipoid disorders: Secondary | ICD-10-CM

## 2024-07-01 DIAGNOSIS — Z1331 Encounter for screening for depression: Secondary | ICD-10-CM | POA: Diagnosis not present

## 2024-07-01 NOTE — Progress Notes (Addendum)
 Patient ID: Kayla Franklin, female   DOB: 25-May-1991, 33 y.o.   MRN: [id number] History of Present Illness: Kayla Franklin is a 33 year old black female, married, [id number], in for a well woman gyn exam and pap. Last pap was in 2016.  No current PCP Current Medications, Allergies, Past Medical History, Past Surgical History, Family History and Social History were reviewed in [employer] record.     Review of Systems: Patient denies any headaches, hearing loss, fatigue, blurred vision, shortness of breath, chest pain, abdominal pain, problems with bowel movements, urination, or intercourse. No joint pain or mood swings.  Has history of fibroids    Physical Exam:BP 139/89 (BP Location: Left Arm, Patient Position: Sitting, Cuff Size: Normal)   Pulse 66   Ht 5' 3 (1.6 m)   Wt 178 lb (80.7 kg)   LMP 06/08/2024 (Exact Date)   BMI 31.53 kg/m   General:  Well developed, well nourished, no acute distress Skin:  Warm and dry Neck:  Midline trachea, normal thyroid, good ROM, no lymphadenopathy Lungs; Clear to auscultation bilaterally Breast:  No dominant palpable mass, retraction, or nipple discharge Cardiovascular: Regular rate and rhythm Abdomen:  Soft, non tender, no hepatosplenomegaly Pelvic:  External genitalia is normal in appearance, no lesions.  The vagina is normal in appearance. Urethra has no lesions or masses. The cervix is bulbous, pap with HR HPV genotyping performed.  Uterus is felt to be normal size, shape, and contour.  No adnexal masses or tenderness noted.Bladder is non tender, no masses felt. Extremities/musculoskeletal:  No swelling or varicosities noted, no clubbing or cyanosis Psych:  No mood changes, alert and cooperative,seems happy AA is 3 Fall risk is low    07/01/2024    9:44 AM 07/05/2017    9:13 AM  Depression screen PHQ 2/9  Decreased Interest 0 0  Down, Depressed, Hopeless 0 0  PHQ - 2 Score 0 0  Altered sleeping 1 0  Tired, decreased energy 1  1  Change in appetite 1 0  Feeling bad or failure about yourself  0 0  Trouble concentrating 0 0  Moving slowly or fidgety/restless 0 0  Suicidal thoughts 0 0  PHQ-9 Score 3 1  Difficult doing work/chores  Not difficult at all       07/01/2024    9:44 AM  GAD 7 : Generalized Anxiety Score  Nervous, Anxious, on Edge 1  Control/stop worrying 0  Worry too much - different things 0  Trouble relaxing 1  Restless 0  Easily annoyed or irritable 2  Afraid - awful might happen 0  Total GAD 7 Score 4    Upstream - 07/01/24 9061       Pregnancy Intention Screening   Does the patient want to become pregnant in the next year? Unsure    Does the patient's partner want to become pregnant in the next year? Unsure    Would the patient like to discuss contraceptive options today? No      Contraception Wrap Up   Current Method Withdrawal or Other Method    End Method Withdrawal or Other Method    Contraception Counseling Provided No           Examination chaperoned by Clarita Salt LPN  Impression and plan: 1. Encounter for gynecological examination with Papanicolaou smear of cervix (Primary) Pap sent Pap in 3 years if normal Physical in 1 year Will check labs - Cytology - PAP( [place]) - CBC - Comprehensive metabolic  panel with GFR - Lipid panel Mammogram at 39 Get PCP  2. Chronic hypertension She is not on meds and wants to try lifestyle modifications first Discussed eating lean meats, veggies and fruit Limit fast foods and processed foods Watch salt and sugar Drink more water Review DASH diet Walk 30 minutes 5/7 days  Will recheck  BP in 3 months and check at home and record for review  3. History of anemia - CBC - Iron, TIBC and Ferritin Panel  4. Screening cholesterol level - Lipid panel

## 2024-07-02 ENCOUNTER — Ambulatory Visit: Payer: Self-pay | Admitting: Adult Health

## 2024-07-02 LAB — CBC
Hematocrit: 26.9 % — ABNORMAL LOW (ref 34.0–46.6)
Hemoglobin: 6.9 g/dL — CL (ref 11.1–15.9)
MCH: 15.6 pg — ABNORMAL LOW (ref 26.6–33.0)
MCHC: 25.7 g/dL — ABNORMAL LOW (ref 31.5–35.7)
MCV: 61 fL — ABNORMAL LOW (ref 79–97)
Platelets: 391 x10E3/uL (ref 150–450)
RBC: 4.41 x10E6/uL (ref 3.77–5.28)
RDW: 20.6 % — ABNORMAL HIGH (ref 11.7–15.4)
WBC: 5.3 x10E3/uL (ref 3.4–10.8)

## 2024-07-02 LAB — COMPREHENSIVE METABOLIC PANEL WITH GFR
ALT: 12 IU/L (ref 0–32)
AST: 18 IU/L (ref 0–40)
Albumin: 4.2 g/dL (ref 3.9–4.9)
Alkaline Phosphatase: 60 IU/L (ref 44–121)
BUN/Creatinine Ratio: 15 (ref 9–23)
BUN: 11 mg/dL (ref 6–20)
Bilirubin Total: 0.4 mg/dL (ref 0.0–1.2)
CO2: 18 mmol/L — ABNORMAL LOW (ref 20–29)
Calcium: 9 mg/dL (ref 8.7–10.2)
Chloride: 105 mmol/L (ref 96–106)
Creatinine, Ser: 0.72 mg/dL (ref 0.57–1.00)
Globulin, Total: 3 g/dL (ref 1.5–4.5)
Glucose: 83 mg/dL (ref 70–99)
Potassium: 4.4 mmol/L (ref 3.5–5.2)
Sodium: 138 mmol/L (ref 134–144)
Total Protein: 7.2 g/dL (ref 6.0–8.5)
eGFR: 112 mL/min/1.73 (ref >59)

## 2024-07-02 LAB — IRON,TIBC AND FERRITIN PANEL
Ferritin: 5 ng/mL — ABNORMAL LOW (ref 15–150)
Iron Saturation: 3 % — CL (ref 15–55)
Iron: 15 ug/dL — ABNORMAL LOW (ref 27–159)
Total Iron Binding Capacity: 465 ug/dL — ABNORMAL HIGH (ref 250–450)
UIBC: 450 ug/dL — ABNORMAL HIGH (ref 131–425)

## 2024-07-02 LAB — LIPID PANEL
Chol/HDL Ratio: 2.4 ratio (ref 0.0–4.4)
Cholesterol, Total: 167 mg/dL (ref 100–199)
HDL: 71 mg/dL (ref >39)
LDL Chol Calc (NIH): 86 mg/dL (ref 0–99)
Triglycerides: 47 mg/dL (ref 0–149)
VLDL Cholesterol Cal: 10 mg/dL (ref 5–40)

## 2024-07-02 NOTE — Telephone Encounter (Signed)
 Left message to call me about blood work

## 2024-07-04 LAB — CYTOLOGY - PAP
Adequacy: ABSENT
Comment: NEGATIVE
Diagnosis: NEGATIVE
High risk HPV: NEGATIVE

## 2024-07-07 ENCOUNTER — Telehealth: Payer: Self-pay | Admitting: Adult Health

## 2024-07-07 NOTE — Telephone Encounter (Signed)
 Called and left message to call me in am at 8:30 am and to get me out of a room so we can talk

## 2024-07-11 MED ORDER — ACCRUFER 30 MG PO CAPS: ORAL_CAPSULE | ORAL | 3 refills | Status: DC

## 2024-07-11 NOTE — Addendum Note (Signed)
 Addended by: Cataleah Stites A on: 07/11/2024 01:03 PM   Modules accepted: Orders

## 2024-07-22 ENCOUNTER — Other Ambulatory Visit: Payer: Self-pay | Admitting: Adult Health

## 2024-07-22 ENCOUNTER — Ambulatory Visit: Payer: Self-pay | Admitting: Adult Health

## 2024-07-22 ENCOUNTER — Other Ambulatory Visit

## 2024-07-22 DIAGNOSIS — N920 Excessive and frequent menstruation with regular cycle: Secondary | ICD-10-CM | POA: Diagnosis not present

## 2024-07-22 DIAGNOSIS — D252 Subserosal leiomyoma of uterus: Secondary | ICD-10-CM

## 2024-07-22 NOTE — Progress Notes (Signed)
 PELVIC US  TA/TV: enlarged heterogeneous uterus with multiple fibroids,largest fibroids #1) calcified subserosal fibroid fundal right 4.8 x 4.3 x 3.7 cm ,(#2)posterior subserosal fibroid  6.2 x 5.3 x 5.2 cm,(#3) posterior fundal subserosal fibroid 5.4 x 3.9 x 6 cm,EEC 8.2 mm,normal ovaries,ovaries best visualized on T/A images,no free fluid  Chaperone Alan

## 2024-08-04 ENCOUNTER — Ambulatory Visit: Admitting: Obstetrics & Gynecology

## 2024-08-04 VITALS — BP 156/80 | HR 67

## 2024-08-04 DIAGNOSIS — D500 Iron deficiency anemia secondary to blood loss (chronic): Secondary | ICD-10-CM

## 2024-08-04 DIAGNOSIS — N920 Excessive and frequent menstruation with regular cycle: Secondary | ICD-10-CM

## 2024-08-04 DIAGNOSIS — D219 Benign neoplasm of connective and other soft tissue, unspecified: Secondary | ICD-10-CM

## 2024-08-04 NOTE — Progress Notes (Signed)
 Follow up appointment for results: sonogram  Chief Complaint  Patient presents with   Fibroids    Discuss options    Blood pressure (!) 156/80, pulse 67.  GYNECOLOGIC SONOGRAM     Kayla Franklin is a 33 y.o. [id number] Patient's last menstrual period was 06/08/2024 (exact date). She is here for a pelvic sonogram for menorrhagia.   Uterus                      10 x 8.8 x 10.5 cm, Total uterine volume 481 cc, enlarged heterogeneous uterus with multiple fibroids,largest fibroids #1) calcified subserosal fibroid fundal right 4.8 x 4.3 x 3.7 cm ,(#2) posterior subserosal fibroid  6.2 x 5.3 x 5.2 cm,(#3) posterior fundal subserosal fibroid 5.4 x 3.9 x 6 cm   Endometrium          8.2 mm, symmetrical, wnl   Right ovary             3.3 x 2.6 x 3.5 cm, normal   Left ovary                3.2 x 1.7 x 2 cm, normal        Technician Comments:   PELVIC US  TA/TV: enlarged heterogeneous uterus with multiple fibroids,largest fibroids #1) calcified subserosal fibroid fundal right 4.8 x 4.3 x 3.7 cm ,(#2)posterior subserosal fibroid  6.2 x 5.3 x 5.2 cm,(#3) posterior fundal subserosal fibroid 5.4 x 3.9 x 6 cm,EEC 8.2 mm,normal ovaries,ovaries best visualized on T/A images,no free fluid   Chaperone [address] JINNY Pitts 07/22/2024 1:14 PM   Clinical Impression and recommendations:   I have reviewed the sonogram results above, combined with the patient's current clinical course, below are my impressions and any appropriate recommendations for management based on the sonographic findings.   Enlarged uterus with multiple fibroids- largest fibroids #1) calcified subserosal fibroid fundal right 4.8 x 4.3 x 3.7 cm ,(#2) posterior subserosal fibroid  6.2 x 5.3 x 5.2 cm,(#3) posterior fundal subserosal fibroid 5.4 x 3.9 x 6 cm Normal endometrium Normal ovaries bilaterally     Jennifer M Ozan 07/22/2024 2:10 PM    MEDS ordered this encounter: No orders of the defined types were placed in this  encounter.   Orders for this encounter: No orders of the defined types were placed in this encounter.   Impression + Management Plan   ICD-10-CM   1. Fibroids  D21.9     2. 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 Menorrhagia with regular cycle  N92.0     Discussed options of oral GnRH agonists vs myomectomy vs hysterectomy Pt and her husband are unsure about future kids She is aware of the severity of her IDA She needs to manage her cycles in some way [place] is in [place]-->she will let me know what she wants to do  Follow Up:   prn   All questions were answered.  Past Medical History:  Diagnosis Date   Anemia    Heart murmur    Heart murmur    Hypertension 03/19/2013   Substance abuse (HCC)    Trichimoniasis    UTI (lower urinary tract infection)     Past Surgical History:  Procedure Laterality Date   CYST EXCISION     nasal cyst    OB History     Gravida  3   Para  1   Term  1   Preterm      AB  2   Living  1      SAB      IAB  1   Ectopic      Multiple  0   Live Births  1           No Known Allergies  Social History   Socioeconomic History   Marital status: Married    Spouse name: Not on file   Number of children: Not on file   Years of education: Not on file   Highest education level: Not on file  Occupational History   Not on file  Tobacco Use   Smoking status: Former    Current packs/day: 0.10    Average packs/day: 0.1 packs/day for 6.0 years (0.6 ttl pk-yrs)    Types: Cigarettes   Smokeless tobacco: Never  Vaping Use   Vaping status: Never Used  Substance and Sexual Activity   Alcohol use: Yes    Comment: occ   Drug use: No    Types: Marijuana    Comment: last used 2 years ago   Sexual activity: Yes    Birth control/protection: None, Other-see comments    Comment: pull out  Other Topics Concern   Not on file  Social History Narrative   Not on file   Social Drivers of  Health   Financial Resource Strain: Low Risk  (07/01/2024)   Overall Financial Resource Strain (CARDIA)    Difficulty of Paying Living Expenses: Not very hard  Food Insecurity: No Food Insecurity (07/01/2024)   Hunger Vital Sign    Worried About Running Out of Food in the Last Year: Never true    Ran Out of Food in the Last Year: Never true  Transportation Needs: No Transportation Needs (07/01/2024)   PRAPARE - Administrator, Civil Service (Medical): No    Lack of Transportation (Non-Medical): No  Physical Activity: Insufficiently Active (07/01/2024)   Exercise Vital Sign    Days of Exercise per Week: 2 days    Minutes of Exercise per Session: 30 min  Stress: No Stress Concern Present (07/01/2024)   [employer] of Occupational Health - Occupational Stress Questionnaire    Feeling of Stress: Only a little  Social Connections: Socially Integrated (07/01/2024)   Social Connection and Isolation Panel    Frequency of Communication with Friends and Family: More than three times a week    Frequency of Social Gatherings with Friends and Family: Twice a week    Attends Religious Services: More than 4 times per year    Active Member of [employer] or Organizations: Yes    Attends Engineer, Structural: More than 4 times per year    Marital Status: Married    Family History  Problem Relation Age of Onset   Hypertension Paternal Grandmother    Breast cancer Maternal Grandmother    Heart disease Father        CHF   Hypertension Father    Hypertension Mother    Hypertension Sister    Healthy Daughter    Diabetes Other        great grandmother

## 2024-10-01 ENCOUNTER — Encounter: Payer: Self-pay | Admitting: Adult Health

## 2024-10-01 ENCOUNTER — Ambulatory Visit: Admitting: Adult Health

## 2024-10-01 VITALS — BP 138/74 | HR 68 | Ht 63.0 in | Wt 180.0 lb

## 2024-10-01 DIAGNOSIS — D500 Iron deficiency anemia secondary to blood loss (chronic): Secondary | ICD-10-CM | POA: Diagnosis not present

## 2024-10-01 DIAGNOSIS — D219 Benign neoplasm of connective and other soft tissue, unspecified: Secondary | ICD-10-CM | POA: Insufficient documentation

## 2024-10-01 DIAGNOSIS — N920 Excessive and frequent menstruation with regular cycle: Secondary | ICD-10-CM

## 2024-10-01 DIAGNOSIS — I10 Essential (primary) hypertension: Secondary | ICD-10-CM

## 2024-10-01 DIAGNOSIS — D259 Leiomyoma of uterus, unspecified: Secondary | ICD-10-CM | POA: Diagnosis not present

## 2024-10-01 NOTE — Progress Notes (Signed)
  Subjective:     Patient ID: Kayla Franklin, female   DOB: 1991-04-12, 33 y.o.   MRN: [id number]  HPI [name] is a 33 year old black female, married, G3P1021 back in follow up on BP, and it is better at home, has been doing lifestyle changes. Periods still heavy but taking iron and has not made decision about fibroids since seeing Dr Jayne.     Component Value Date/Time   DIAGPAP  07/01/2024 0943    - Negative for intraepithelial lesion or malignancy (NILM)   HPVHIGH Negative 07/01/2024 0943   ADEQPAP  07/01/2024 0943    Satisfactory for evaluation; transformation zone component ABSENT.     Review of Systems Periods still heavy Not as tired Reviewed past medical,surgical, social and family history. Reviewed medications and allergies.     Objective:   Physical Exam BP 138/74 (BP Location: Left Arm, Patient Position: Sitting, Cuff Size: Normal)   Pulse 68   Ht 5' 3 (1.6 m)   Wt 180 lb (81.6 kg)   LMP 09/26/2024 (Exact Date)   BMI 31.89 kg/m     Skin warm and dry.  Lungs: clear to ausculation bilaterally. Cardiovascular: regular rate and rhythm. Fall risk is low  Upstream - 10/01/24 1424       Pregnancy Intention Screening   Does the patient want to become pregnant in the next year? No    Does the patient's partner want to become pregnant in the next year? No    Would the patient like to discuss contraceptive options today? No      Contraception Wrap Up   Current Method Withdrawal or Other Method    End Method Withdrawal or Other Method           Assessment:     1. Chronic hypertension (Primary) BP is better, keep check at home and doing lifestyle modifications  2. Iron deficiency anemia due to chronic menorrhagia Continue iron and eating red meat, try beets too Will check CBC and iron panel - CBC - Iron, TIBC and Ferritin Panel  3. Fibroids Gave handout on myfembree and myomectomy  Follow up with Dr Jayne 11/03/24  4. Menorrhagia with regular cycle Periods  still heavy  Check CBC and iron panel  - CBC - Iron, TIBC and Ferritin Panel     Plan:     Return 11/03/24 to talk with Dr Jayne about fibroid options

## 2024-10-02 ENCOUNTER — Ambulatory Visit: Payer: Self-pay | Admitting: Adult Health

## 2024-10-02 LAB — CBC
Hematocrit: 25.5 % — ABNORMAL LOW (ref 34.0–46.6)
Hemoglobin: 6.6 g/dL — CL (ref 11.1–15.9)
MCH: 16.1 pg — ABNORMAL LOW (ref 26.6–33.0)
MCHC: 25.9 g/dL — ABNORMAL LOW (ref 31.5–35.7)
MCV: 62 fL — ABNORMAL LOW (ref 79–97)
Platelets: 450 x10E3/uL (ref 150–450)
RBC: 4.11 x10E6/uL (ref 3.77–5.28)
RDW: 21.9 % — ABNORMAL HIGH (ref 11.7–15.4)
WBC: 5.4 x10E3/uL (ref 3.4–10.8)

## 2024-10-02 LAB — IRON,TIBC AND FERRITIN PANEL
Ferritin: 6 ng/mL — ABNORMAL LOW (ref 15–150)
Iron Saturation: 3 % — CL (ref 15–55)
Iron: 16 ug/dL — ABNORMAL LOW (ref 27–159)
Total Iron Binding Capacity: 462 ug/dL — ABNORMAL HIGH (ref 250–450)
UIBC: 446 ug/dL — ABNORMAL HIGH (ref 131–425)

## 2024-11-03 ENCOUNTER — Ambulatory Visit: Admitting: Obstetrics & Gynecology

## 2024-11-03 MED ORDER — MYFEMBREE 40-1-0.5 MG PO TABS: 1.0000 | ORAL_TABLET | Freq: Every day | ORAL | 11 refills | Status: AC
